# Patient Record
Sex: Female | Born: 1965
Health system: Southern US, Community
[De-identification: ages and names within clinical notes are randomized; demographics above are authoritative.]

## PROBLEM LIST (undated history)

## (undated) DIAGNOSIS — K219 Gastro-esophageal reflux disease without esophagitis: Secondary | ICD-10-CM

## (undated) DIAGNOSIS — E7800 Pure hypercholesterolemia, unspecified: Principal | ICD-10-CM

## (undated) DIAGNOSIS — F419 Anxiety disorder, unspecified: Secondary | ICD-10-CM

## (undated) DIAGNOSIS — D229 Melanocytic nevi, unspecified: Secondary | ICD-10-CM

## (undated) DIAGNOSIS — F329 Major depressive disorder, single episode, unspecified: Secondary | ICD-10-CM

## (undated) DIAGNOSIS — I10 Essential (primary) hypertension: Secondary | ICD-10-CM

## (undated) DIAGNOSIS — F32A Depression, unspecified: Secondary | ICD-10-CM

## (undated) DIAGNOSIS — E559 Vitamin D deficiency, unspecified: Secondary | ICD-10-CM

## (undated) HISTORY — PX: AUGMENTATION MAMMAPLASTY: SUR837

## (undated) HISTORY — PX: ENDOMETRIAL ABLATION: SHX621

## (undated) HISTORY — DX: Melanocytic nevi, unspecified: D22.9

## (undated) HISTORY — DX: Gastro-esophageal reflux disease without esophagitis: K21.9

## (undated) HISTORY — DX: Pure hypercholesterolemia, unspecified: E78.00

## (undated) HISTORY — DX: Vitamin D deficiency, unspecified: E55.9

## (undated) HISTORY — PX: TUBAL LIGATION: SHX77

---

## 2004-01-30 ENCOUNTER — Ambulatory Visit (HOSPITAL_COMMUNITY): Admission: RE | Admit: 2004-01-30 | Discharge: 2004-01-30 | Payer: Self-pay | Admitting: Obstetrics & Gynecology

## 2004-03-31 ENCOUNTER — Emergency Department (HOSPITAL_COMMUNITY): Admission: EM | Admit: 2004-03-31 | Discharge: 2004-03-31 | Payer: Self-pay | Admitting: Family Medicine

## 2004-07-27 ENCOUNTER — Emergency Department (HOSPITAL_COMMUNITY): Admission: EM | Admit: 2004-07-27 | Discharge: 2004-07-27 | Payer: Self-pay | Admitting: Emergency Medicine

## 2004-12-04 ENCOUNTER — Other Ambulatory Visit: Admission: RE | Admit: 2004-12-04 | Discharge: 2004-12-04 | Payer: Self-pay | Admitting: Obstetrics & Gynecology

## 2005-01-15 ENCOUNTER — Ambulatory Visit (HOSPITAL_COMMUNITY): Admission: RE | Admit: 2005-01-15 | Discharge: 2005-01-15 | Payer: Self-pay | Admitting: Obstetrics & Gynecology

## 2007-12-01 ENCOUNTER — Ambulatory Visit (HOSPITAL_COMMUNITY): Admission: RE | Admit: 2007-12-01 | Discharge: 2007-12-01 | Payer: Self-pay | Admitting: Family Medicine

## 2010-07-05 NOTE — Op Note (Signed)
[name], [name] NO.:  [id number]   MEDICAL RECORD NO.:  [id number]          PATIENT TYPE:  AMB   LOCATION:  DAY                           FACILITY:  [facility]   PHYSICIAN:  Lazaro Arms, M.D.   DATE OF BIRTH:  23-Sep-1965   DATE OF PROCEDURE:  01/30/2004  DATE OF DISCHARGE:                                 OPERATIVE REPORT   PREOPERATIVE DIAGNOSES:  1.  Menometrorrhagia.  2.  Dysmenorrhea.   POSTOPERATIVE DIAGNOSES:  1.  Menometrorrhagia.  2.  Dysmenorrhea.   PROCEDURE:  Hysteroscopy, dilatation and curettage, with endometrial  ablation using HydroThermAblator.   SURGEON:  Lazaro Arms, M.D.   ANESTHESIA:  Laryngeal mask airway.   FINDINGS:  The patient had a normal endometrial cavity.  There were no  abnormalities whatsoever.  She had a relatively small uterus that sounded to  9 cm.   DESCRIPTION OF OPERATION:  The patient was taken to the operating room and  placed in the supine position, where she underwent laryngeal mask airway.  She was placed in the dorsal lithotomy position and prepped and draped in  the usual sterile fashion.  A Graves speculum was placed.  Her cervix was  grasped.  A paracervical block using 0.5% Marcaine with 1:200,000  epinephrine was placed.  The uterus was sounded.  The cervix was dilated  serially to allow passage of the HydroThermAblator hysteroscope.  The above-  noted findings were seen.  The uterus was curetted with endometrial curette  with good uterine cry in all areas.  The HydroThermAblator was then used.  The uterus was filled with fluid to appropriate pressure.  It was then  heated up to 81 degrees Celsius and had a heating phase for 10 minutes.  All  the fluid was accounted for before, during and after the procedure.  There  was no loss of any fluid whatsoever.  The patient was awakened from  anesthesia and taken to the recovery room in good, stable condition.  All  counts were correct.  The patient will be  seen in the office next week.  She  received Ancef and Toradol prophylactically.     Luth   LHE/MEDQ  D:  01/30/2004  T:  01/30/2004  Job:  [id number]

## 2010-07-05 NOTE — Op Note (Signed)
NAME:  Allison Carr, HUFFSTETLER NO.:  [id number]   MEDICAL RECORD NO.:  [id number]          PATIENT TYPE:  AMB   LOCATION:                                FACILITY:  [facility]   PHYSICIAN:  Lazaro Arms, M.D.        DATE OF BIRTH:   DATE OF PROCEDURE:  01/15/2005  DATE OF DISCHARGE:                                 OPERATIVE REPORT   PREOPERATIVE DIAGNOSIS:  Cervical dysplasia, moderate.   POSTOPERATIVE DIAGNOSIS:  Cervical dysplasia, moderate.   PROCEDURE:  Laser ablation of the cervix.   SURGEON:  Lazaro Arms, M.D.   ANESTHESIA:  Laryngeal mask airway.   FINDINGS:  The patient was known to have moderate cervical dysplasia from  the colposcopy with biopsy in the office.  Today colposcopy is performed.  She has a relatively small lesion that goes within the cervical canal just a  touch.  It can be seen in its entirety.  As a result, a deep, relatively  small cone was performed.   DESCRIPTION OF OPERATION:  The patient was taken to the operating room and  placed in the supine position and underwent laryngeal mask airway, placed in  the dorsal lithotomy position.  Bilateral paracervical block was placed  using 0.5% Marcaine.  The holmium laser was used and got good margin around  the dysplasia taking very deep, approximately 9-cm central.  There was no  bleeding.  The patient tolerated it well.  She was awakened from anesthesia,  taken to the recovery room in stable condition.  We will see her back in a  month.      Lazaro Arms, M.D.  Electronically Signed     LHE/MEDQ  D:  01/15/2005  T:  01/15/2005  Job:  [id number]

## 2011-05-07 ENCOUNTER — Other Ambulatory Visit: Payer: Self-pay | Admitting: Adult Health

## 2011-05-07 ENCOUNTER — Other Ambulatory Visit (HOSPITAL_COMMUNITY)
Admission: RE | Admit: 2011-05-07 | Discharge: 2011-05-07 | Disposition: A | Discharge status: Home / Self Care | Payer: Self-pay | Source: Ambulatory Visit | Attending: Obstetrics and Gynecology | Admitting: Obstetrics and Gynecology

## 2011-05-07 DIAGNOSIS — Z01419 Encounter for gynecological examination (general) (routine) without abnormal findings: Secondary | ICD-10-CM | POA: Insufficient documentation

## 2011-05-07 DIAGNOSIS — Z113 Encounter for screening for infections with a predominantly sexual mode of transmission: Secondary | ICD-10-CM | POA: Insufficient documentation

## 2012-05-09 ENCOUNTER — Encounter (HOSPITAL_COMMUNITY): Payer: Self-pay | Admitting: *Deleted

## 2012-05-09 ENCOUNTER — Emergency Department (HOSPITAL_COMMUNITY)
Admission: EM | Admit: 2012-05-09 | Discharge: 2012-05-10 | Disposition: A | Discharge status: Home / Self Care | Payer: 59 | Attending: Emergency Medicine | Admitting: Emergency Medicine

## 2012-05-09 DIAGNOSIS — R5381 Other malaise: Secondary | ICD-10-CM | POA: Insufficient documentation

## 2012-05-09 DIAGNOSIS — F17200 Nicotine dependence, unspecified, uncomplicated: Secondary | ICD-10-CM | POA: Insufficient documentation

## 2012-05-09 DIAGNOSIS — I10 Essential (primary) hypertension: Secondary | ICD-10-CM | POA: Insufficient documentation

## 2012-05-09 DIAGNOSIS — F1010 Alcohol abuse, uncomplicated: Secondary | ICD-10-CM | POA: Insufficient documentation

## 2012-05-09 DIAGNOSIS — R4589 Other symptoms and signs involving emotional state: Secondary | ICD-10-CM

## 2012-05-09 DIAGNOSIS — T424X4A Poisoning by benzodiazepines, undetermined, initial encounter: Secondary | ICD-10-CM | POA: Insufficient documentation

## 2012-05-09 DIAGNOSIS — Z8659 Personal history of other mental and behavioral disorders: Secondary | ICD-10-CM | POA: Insufficient documentation

## 2012-05-09 DIAGNOSIS — Z3202 Encounter for pregnancy test, result negative: Secondary | ICD-10-CM | POA: Insufficient documentation

## 2012-05-09 DIAGNOSIS — T43502A Poisoning by unspecified antipsychotics and neuroleptics, intentional self-harm, initial encounter: Secondary | ICD-10-CM | POA: Insufficient documentation

## 2012-05-09 HISTORY — DX: Depression, unspecified: F32.A

## 2012-05-09 HISTORY — DX: Essential (primary) hypertension: I10

## 2012-05-09 HISTORY — DX: Major depressive disorder, single episode, unspecified: F32.9

## 2012-05-09 HISTORY — DX: Anxiety disorder, unspecified: F41.9

## 2012-05-09 LAB — CBC WITH DIFFERENTIAL/PLATELET
Basophils Relative: 1 % (ref 0–1)
HCT: 41.6 % (ref 36.0–46.0)
Hemoglobin: 14.4 g/dL (ref 12.0–15.0)
MCHC: 34.6 g/dL (ref 30.0–36.0)
MCV: 102 fL — ABNORMAL HIGH (ref 78.0–100.0)
Monocytes Absolute: 0.6 10*3/uL (ref 0.1–1.0)
Neutrophils Relative %: 39 % — ABNORMAL LOW (ref 43–77)
Platelets: 251 10*3/uL (ref 150–400)
RBC: 4.08 MIL/uL (ref 3.87–5.11)

## 2012-05-09 LAB — COMPREHENSIVE METABOLIC PANEL
ALT: 20 U/L (ref 0–35)
Alkaline Phosphatase: 71 U/L (ref 39–117)
BUN: 9 mg/dL (ref 6–23)
Calcium: 9.3 mg/dL (ref 8.4–10.5)
Creatinine, Ser: 0.58 mg/dL (ref 0.50–1.10)
Glucose, Bld: 92 mg/dL (ref 70–99)
Sodium: 141 mEq/L (ref 135–145)
Total Protein: 7.1 g/dL (ref 6.0–8.3)

## 2012-05-09 LAB — ETHANOL: Alcohol, Ethyl (B): 274 mg/dL — ABNORMAL HIGH (ref 0–11)

## 2012-05-09 MED ORDER — SODIUM CHLORIDE 0.9 % IV BOLUS (SEPSIS): 1000.0000 mL | Freq: Once | INTRAVENOUS | Status: DC

## 2012-05-09 MED ORDER — SODIUM CHLORIDE 0.9 % IV BOLUS (SEPSIS)
1000.0000 mL | Freq: Once | INTRAVENOUS | Status: AC
  Administered 2012-05-09: 1000 mL via INTRAVENOUS

## 2012-05-09 NOTE — ED Provider Notes (Signed)
History    This chart was scribed for Allison Lennert, MD by Charolett Bumpers, ED Scribe. The patient was seen in room [facility]. Patient's care was started at 9:57 PM.   CSN: [id number]  Arrival date & time 05/09/12  2126   First MD Initiated Contact with Patient 05/09/12 2157      Chief Complaint  Patient presents with  . Drug Overdose   Level V Caveat: Drug overdose  HPI Comments: Allison Carr is a 47 y.o. female who presents to the Emergency Department complaining for drug overdose. Per boyfriend, pt told him that she had been drinking alcohol earlier today and took x30 Xanax today. When asked why she took the Xanax, she told her boyfriend that she was "tired of everything". When questioned about taking other medications but does not respond.   PCP: Dr. Regino Schultze   Patient is a 47 y.o. female presenting with Overdose. The history is provided by the patient and a friend. No language interpreter was used.  Drug Overdose    Past Medical History  Diagnosis Date  . Hypertension   . Anxiety   . Depression     History reviewed. No pertinent past surgical history.  History reviewed. No pertinent family history.  History  Substance Use Topics  . Smoking status: Current Every Day Smoker  . Smokeless tobacco: Not on file  . Alcohol Use: Yes    OB History   Grav Para Term Preterm Abortions TAB SAB Ect Mult Living                  Review of Systems  Unable to perform ROS: Other  Drug overdose, pt lethargic and does not answer questions  Allergies  Review of patient's allergies indicates no known allergies.  Home Medications     Triage Vitals: BP 95/44  Pulse 97  Temp(Src) 97.9 F (36.6 C) (Oral)  Resp 18  Ht 5\' 2"  (1.575 m)  Wt 119 lb (53.978 kg)  BMI 21.76 kg/m2  SpO2 97%  Physical Exam  Constitutional: She appears well-developed. She appears lethargic.  HENT:  Head: Normocephalic and atraumatic.  Eyes: Conjunctivae and EOM are normal. No  scleral icterus.  Neck: Neck supple. No thyromegaly present.  Cardiovascular: Normal rate, regular rhythm and normal heart sounds.  Exam reveals no gallop and no friction rub.   No murmur heard. Pulmonary/Chest: Effort normal and breath sounds normal. No stridor. No respiratory distress. She has no wheezes. She has no rales. She exhibits no tenderness.  Abdominal: Soft. She exhibits no distension. There is no tenderness. There is no rebound.  Musculoskeletal: Normal range of motion. She exhibits no edema.  Lymphadenopathy:    She has no cervical adenopathy.  Neurological: She appears lethargic. Coordination normal.  Oriented to person and place.   Skin: No rash noted. No erythema.    ED Course  Procedures (including critical care time)  DIAGNOSTIC STUDIES: Oxygen Saturation is 97% on RA, adequate by my interpretation.    COORDINATION OF CARE:  9:40-Pt is lethargic at bedside. Will order blood work, UA, check acetaminophen level and drug and ethanol screens.    Labs Reviewed  CBC WITH DIFFERENTIAL  COMPREHENSIVE METABOLIC PANEL  ETHANOL  URINE RAPID DRUG SCREEN (HOSP PERFORMED)  URINALYSIS, ROUTINE W REFLEX MICROSCOPIC   No results found.   No diagnosis found.  Pt medically stable will get telepsych in am  MDM      The chart was scribed for me under my direct  supervision.  I personally performed the history, physical, and medical decision making and all procedures in the evaluation of this patient.Allison Lennert, MD 05/09/12 2351

## 2012-05-09 NOTE — ED Notes (Addendum)
Pt took 25-30 xanax. Pt has been drinking alcohol. According to boyfriend, pt states "I'm just tired of it."

## 2012-05-10 LAB — URINALYSIS, ROUTINE W REFLEX MICROSCOPIC
Bilirubin Urine: NEGATIVE
Glucose, UA: NEGATIVE mg/dL
Leukocytes, UA: NEGATIVE
Protein, ur: NEGATIVE mg/dL
Specific Gravity, Urine: <1.005 — ABNORMAL LOW (ref 1.005–1.030)
pH: 5.5 (ref 5.0–8.0)

## 2012-05-10 LAB — RAPID URINE DRUG SCREEN, HOSP PERFORMED
Amphetamines: NOT DETECTED
Cocaine: NOT DETECTED

## 2012-05-10 LAB — ETHANOL: Alcohol, Ethyl (B): 100 mg/dL — ABNORMAL HIGH (ref 0–11)

## 2012-05-10 MED ORDER — POTASSIUM CHLORIDE CRYS ER 20 MEQ PO TBCR
60.0000 meq | EXTENDED_RELEASE_TABLET | Freq: Once | ORAL | Status: AC
  Administered 2012-05-10: 60 meq via ORAL
  Filled 2012-05-10: qty 3

## 2012-05-10 MED ORDER — SODIUM CHLORIDE 0.9 % IV BOLUS (SEPSIS): 1000.0000 mL | Freq: Once | INTRAVENOUS | Status: DC

## 2012-05-10 NOTE — ED Provider Notes (Addendum)
2345 Assumed care/disposition of patient who arrived having taken xanax and ETOH. She is sleeping and the plan is for her to be evaluated by Tele-psych once she is able to carry on a conversation. Potassium was low and has been replaced since arrival.ETOH is 274. It will be repeated in the AM.  Results for orders placed during the hospital encounter of 05/09/12  CBC WITH DIFFERENTIAL      Result Value Range   WBC 5.7  4.0 - 10.5 K/uL   RBC 4.08  3.87 - 5.11 MIL/uL   Hemoglobin 14.4  12.0 - 15.0 g/dL   HCT 82.9  56.2 - 13.0 %   MCV 102.0 (*) 78.0 - 100.0 fL   MCH 35.3 (*) 26.0 - 34.0 pg   MCHC 34.6  30.0 - 36.0 g/dL   RDW 86.5  78.4 - 69.6 %   Platelets 251  150 - 400 K/uL   Neutrophils Relative 39 (*) 43 - 77 %   Neutro Abs 2.2  1.7 - 7.7 K/uL   Lymphocytes Relative 47 (*) 12 - 46 %   Lymphs Abs 2.7  0.7 - 4.0 K/uL   Monocytes Relative 11  3 - 12 %   Monocytes Absolute 0.6  0.1 - 1.0 K/uL   Eosinophils Relative 2  0 - 5 %   Eosinophils Absolute 0.1  0.0 - 0.7 K/uL   Basophils Relative 1  0 - 1 %   Basophils Absolute 0.1  0.0 - 0.1 K/uL  COMPREHENSIVE METABOLIC PANEL      Result Value Range   Sodium 141  135 - 145 mEq/L   Potassium 3.3 (*) 3.5 - 5.1 mEq/L   Chloride 102  96 - 112 mEq/L   CO2 29  19 - 32 mEq/L   Glucose, Bld 92  70 - 99 mg/dL   BUN 9  6 - 23 mg/dL   Creatinine, Ser 2.95  0.50 - 1.10 mg/dL   Calcium 9.3  8.4 - 28.4 mg/dL   Total Protein 7.1  6.0 - 8.3 g/dL   Albumin 3.9  3.5 - 5.2 g/dL   AST 22  0 - 37 U/L   ALT 20  0 - 35 U/L   Alkaline Phosphatase 71  39 - 117 U/L   Total Bilirubin <0.1 (*) 0.3 - 1.2 mg/dL   GFR calc non Af Amer >90  >90 mL/min   GFR calc Af Amer >90  >90 mL/min  ETHANOL      Result Value Range   Alcohol, Ethyl (B) 274 (*) 0 - 11 mg/dL  URINE RAPID DRUG SCREEN (HOSP PERFORMED)      Result Value Range   Opiates NONE DETECTED  NONE DETECTED   Cocaine NONE DETECTED  NONE DETECTED   Benzodiazepines NONE DETECTED  NONE DETECTED   Amphetamines NONE DETECTED  NONE DETECTED   Tetrahydrocannabinol NONE DETECTED  NONE DETECTED   Barbiturates NONE DETECTED  NONE DETECTED  URINALYSIS, ROUTINE W REFLEX MICROSCOPIC      Result Value Range   Color, Urine YELLOW  YELLOW   APPearance CLEAR  CLEAR   Specific Gravity, Urine <1.005 (*) 1.005 - 1.030   pH 5.5  5.0 - 8.0   Glucose, UA NEGATIVE  NEGATIVE mg/dL   Hgb urine dipstick SMALL (*) NEGATIVE   Bilirubin Urine NEGATIVE  NEGATIVE   Ketones, ur NEGATIVE  NEGATIVE mg/dL   Protein, ur NEGATIVE  NEGATIVE mg/dL   Urobilinogen, UA 0.2  0.0 - 1.0 mg/dL  Nitrite NEGATIVE  NEGATIVE   Leukocytes, UA NEGATIVE  NEGATIVE  ACETAMINOPHEN LEVEL      Result Value Range   Acetaminophen (Tylenol), Serum <15.0  10 - 30 ug/mL  URINE MICROSCOPIC-ADD ON      Result Value Range   Squamous Epithelial / LPF RARE  RARE   WBC, UA 0-2  <3 WBC/hpf   RBC / HPF 0-2  <3 RBC/hpf   Bacteria, UA RARE  RARE  POCT PREGNANCY, URINE      Result Value Range   Preg Test, Ur NEGATIVE  NEGATIVE   [phone] Tele psych consult has been completed by Dr. Debbora Presto. They feel patient is good to be discharged home once she is cleared medically. She can follow up with outpatient alcohol and depression. Will wait 6 AM ETOH  0600 ETOH is 100. Spoke with the patient. She is ready for discharge. Nicoletta Dress. Colon Branch, MD 05/10/12 Jeralyn Bennett  Nicoletta Dress. Colon Branch, MD 05/10/12 [phone]

## 2012-05-10 NOTE — ED Notes (Signed)
Patient speaking with telepsych at this time. Patient is angry and agitated. States she wants to go home.

## 2013-06-01 ENCOUNTER — Ambulatory Visit (INDEPENDENT_AMBULATORY_CARE_PROVIDER_SITE_OTHER): Payer: BC Managed Care – PPO | Admitting: Adult Health

## 2013-06-01 ENCOUNTER — Other Ambulatory Visit (HOSPITAL_COMMUNITY)
Admission: RE | Admit: 2013-06-01 | Discharge: 2013-06-01 | Disposition: A | Discharge status: Home / Self Care | Payer: BC Managed Care – PPO | Source: Ambulatory Visit | Attending: Adult Health | Admitting: Adult Health

## 2013-06-01 ENCOUNTER — Encounter: Payer: Self-pay | Admitting: Adult Health

## 2013-06-01 VITALS — BP 152/94 | HR 76 | Ht 62.0 in | Wt 149.5 lb

## 2013-06-01 DIAGNOSIS — I10 Essential (primary) hypertension: Secondary | ICD-10-CM

## 2013-06-01 DIAGNOSIS — Z1212 Encounter for screening for malignant neoplasm of rectum: Secondary | ICD-10-CM

## 2013-06-01 DIAGNOSIS — D229 Melanocytic nevi, unspecified: Secondary | ICD-10-CM

## 2013-06-01 DIAGNOSIS — Z1151 Encounter for screening for human papillomavirus (HPV): Secondary | ICD-10-CM | POA: Insufficient documentation

## 2013-06-01 DIAGNOSIS — F329 Major depressive disorder, single episode, unspecified: Secondary | ICD-10-CM

## 2013-06-01 DIAGNOSIS — F32A Depression, unspecified: Secondary | ICD-10-CM

## 2013-06-01 DIAGNOSIS — Z01419 Encounter for gynecological examination (general) (routine) without abnormal findings: Secondary | ICD-10-CM

## 2013-06-01 HISTORY — DX: Melanocytic nevi, unspecified: D22.9

## 2013-06-01 LAB — CBC
HEMATOCRIT: 43.2 % (ref 36.0–46.0)
HEMOGLOBIN: 14.9 g/dL (ref 12.0–15.0)
MCH: 35.2 pg — ABNORMAL HIGH (ref 26.0–34.0)
MCHC: 34.5 g/dL (ref 30.0–36.0)
MCV: 102.1 fL — ABNORMAL HIGH (ref 78.0–100.0)
Platelets: 254 10*3/uL (ref 150–400)
RBC: 4.23 MIL/uL (ref 3.87–5.11)
RDW: 12.7 % (ref 11.5–15.5)
WBC: 6.6 10*3/uL (ref 4.0–10.5)

## 2013-06-01 LAB — HEMOCCULT GUIAC POC 1CARD (OFFICE): Fecal Occult Blood, POC: NEGATIVE

## 2013-06-01 MED ORDER — LOSARTAN POTASSIUM 50 MG PO TABS: 50.0000 mg | ORAL_TABLET | Freq: Every day | ORAL | Status: DC

## 2013-06-01 MED ORDER — VENLAFAXINE HCL ER 75 MG PO CP24: 75.0000 mg | ORAL_CAPSULE | Freq: Every day | ORAL | Status: DC

## 2013-06-01 NOTE — Patient Instructions (Addendum)
Physical in 1 year Mammogram now and yearly colonoscopy at 31 See Dr Tarri Glenn regarding moles Return in 2 weeks for recheck

## 2013-06-01 NOTE — Progress Notes (Signed)
Patient ID: Allison Carr, female   DOB: May 05, 1965, 48 y.o.   MRN: [id number] History of Present Illness: Allison Carr is a 48 year old white female divorced in for a pap and physical.She has been off BP meds since December.    Current Medications, Allergies, Past Medical History, Past Surgical History, Family History and Social History were reviewed in [employer] record.     Review of Systems: Patient denies any headaches, blurred vision, shortness of breath, chest pain, abdominal pain, problems with bowel movements, urination, or intercourse. No joint pain or has depression since divorce.    Physical Exam:BP 152/94  Pulse 76  Ht 5\' 2"  (1.575 m)  Wt 149 lb 8 oz (67.813 kg)  BMI 27.34 kg/m2BP was 152/108 on arrival General:  Well developed, well nourished, no acute distress Skin:  Warm and dry Neck:  Midline trachea, normal thyroid Lungs; Clear to auscultation bilaterally Breast:  No dominant palpable mass, retraction, or nipple discharge, has bilateral implants, has mole left chest of concern Cardiovascular: Regular rate and rhythm Abdomen:  Soft, non tender, no hepatosplenomegaly Pelvic:  External genitalia is normal in appearance.has mole of concern right inner thigh,irregular with black scaly area.  The vagina is normal in appearance. The cervix is bulbous.Pap with HPV performed.  Uterus is felt to be normal size, shape, and contour.  No   adnexal masses or tenderness noted. Rectal: Good sphincter tone, no polyps, or hemorrhoids felt.  Hemoccult negative. Extremities:  No swelling or varicosities noted Psych:  Alert and cooperative,seems happy   Impression:  Yearly gyn exam Hypertension Moles of concern Depression    Plan: Get mammogram now and yearly Return in 2 weeks for BP check Check CBC,CMP,TSH and lipids Refilled losartin 50 mg #30 1 daily x 1 year  and refilled Effexor ER 75 mg #30 1 daily x 1 year Physical in 1 year Call Dr Allison Carr to  evaluate moles

## 2013-06-02 LAB — LIPID PANEL
CHOLESTEROL: 279 mg/dL — AB (ref 0–200)
HDL: 83 mg/dL (ref >39)
LDL CALC: 165 mg/dL — AB (ref 0–99)
TRIGLYCERIDES: 155 mg/dL — AB (ref <150)
Total CHOL/HDL Ratio: 3.4 Ratio
VLDL: 31 mg/dL (ref 0–40)

## 2013-06-02 LAB — COMPREHENSIVE METABOLIC PANEL
ALBUMIN: 4.2 g/dL (ref 3.5–5.2)
ALK PHOS: 68 U/L (ref 39–117)
ALT: 25 U/L (ref 0–35)
AST: 25 U/L (ref 0–37)
BUN: 7 mg/dL (ref 6–23)
CALCIUM: 9.6 mg/dL (ref 8.4–10.5)
CHLORIDE: 99 meq/L (ref 96–112)
CO2: 27 mEq/L (ref 19–32)
Creat: 0.53 mg/dL (ref 0.50–1.10)
GLUCOSE: 69 mg/dL — AB (ref 70–99)
POTASSIUM: 4.4 meq/L (ref 3.5–5.3)
SODIUM: 135 meq/L (ref 135–145)
TOTAL PROTEIN: 6.8 g/dL (ref 6.0–8.3)
Total Bilirubin: 0.5 mg/dL (ref 0.2–1.2)

## 2013-06-02 LAB — TSH: TSH: 3.008 u[IU]/mL (ref 0.350–4.500)

## 2013-06-07 ENCOUNTER — Telehealth: Payer: Self-pay | Admitting: Adult Health

## 2013-06-07 MED ORDER — ESOMEPRAZOLE MAGNESIUM 40 MG PO CPDR: 40.0000 mg | DELAYED_RELEASE_CAPSULE | Freq: Every day | ORAL | Status: DC

## 2013-06-07 NOTE — Telephone Encounter (Signed)
Refilled nexium 

## 2013-06-07 NOTE — Telephone Encounter (Signed)
Pt requesting refill on Nexium, states thought refill was done on 06/01/2013 appt but pharmacy states does not have RX.

## 2013-06-14 ENCOUNTER — Telehealth: Payer: Self-pay | Admitting: Adult Health

## 2013-06-14 NOTE — Telephone Encounter (Signed)
Pt aware of labs and pap 

## 2013-06-15 ENCOUNTER — Ambulatory Visit (INDEPENDENT_AMBULATORY_CARE_PROVIDER_SITE_OTHER): Payer: BC Managed Care – PPO | Admitting: Adult Health

## 2013-06-15 ENCOUNTER — Encounter: Payer: Self-pay | Admitting: Adult Health

## 2013-06-15 VITALS — BP 138/92 | Ht 62.0 in | Wt 152.0 lb

## 2013-06-15 DIAGNOSIS — I10 Essential (primary) hypertension: Secondary | ICD-10-CM

## 2013-06-15 MED ORDER — ESOMEPRAZOLE MAGNESIUM 40 MG PO CPDR: 40.0000 mg | DELAYED_RELEASE_CAPSULE | Freq: Every day | ORAL | Status: DC

## 2013-06-15 NOTE — Patient Instructions (Signed)
Follow up in  1year Increase activity

## 2013-06-15 NOTE — Progress Notes (Signed)
Subjective:     Patient ID: Allison Carr, female   DOB: 19-Aug-1965, 48 y.o.   MRN: [id number]  HPI Allison Carr is back for BP check, it is better,no complaints.  Review of Systems See HPI Reviewed past medical,surgical, social and family history. Reviewed medications and allergies.     Objective:   Physical Exam BP 138/92  Ht 5\' 2"  (1.575 m)  Wt 152 lb (68.947 kg)  BMI 27.79 kg/m2   Taking meds,doing better, has appt this pm with Dr Tarri Glenn for mole removal Had reviewed last pm by phone.  Assessment:     Hypertension    Plan:     Continue meds Increase activity Follow up in 1 year

## 2013-06-22 ENCOUNTER — Telehealth: Payer: Self-pay | Admitting: *Deleted

## 2013-06-22 NOTE — Telephone Encounter (Signed)
Pt aware that insurance is not going to cover the generic Nexium.

## 2013-12-19 ENCOUNTER — Encounter: Payer: Self-pay | Admitting: Adult Health

## 2014-03-07 ENCOUNTER — Telehealth: Payer: Self-pay | Admitting: Adult Health

## 2014-03-08 MED ORDER — LISINOPRIL 20 MG PO TABS: 20.0000 mg | ORAL_TABLET | Freq: Every day | ORAL | Status: DC

## 2014-03-08 NOTE — Telephone Encounter (Signed)
Left message wil try lisinopril check BP in about 4 weeks

## 2014-04-26 ENCOUNTER — Telehealth: Payer: Self-pay | Admitting: Adult Health

## 2014-04-26 NOTE — Telephone Encounter (Signed)
Left message x 1. JSY 

## 2014-04-27 MED ORDER — TRIAMTERENE-HCTZ 37.5-25 MG PO CAPS: 1.0000 | ORAL_CAPSULE | Freq: Every day | ORAL | Status: DC

## 2014-04-27 NOTE — Telephone Encounter (Signed)
Has cough with lisinopril wil stop and rx dyazide check BP and cal me in about 4 weeks

## 2014-04-27 NOTE — Telephone Encounter (Signed)
Spoke with pt. Pt is on Lisinopril. It is causing a dry cough. Pt is requesting another BP med that's on the $4.00 [facility] list. Please advise. Pt is also on Effexor. Her father passed away and had several prescriptions of an antidepressant. She was wondering if she could take that med. I advised not to since it wasn't prescribed to her. Pt voiced understanding. [place]

## 2014-06-01 ENCOUNTER — Other Ambulatory Visit: Payer: Self-pay | Admitting: Adult Health

## 2014-12-01 ENCOUNTER — Other Ambulatory Visit: Payer: Self-pay | Admitting: Adult Health

## 2014-12-28 ENCOUNTER — Other Ambulatory Visit (HOSPITAL_COMMUNITY): Payer: Self-pay | Admitting: Family Medicine

## 2014-12-28 DIAGNOSIS — Z1231 Encounter for screening mammogram for malignant neoplasm of breast: Secondary | ICD-10-CM

## 2015-01-05 ENCOUNTER — Other Ambulatory Visit: Payer: Self-pay | Admitting: Adult Health

## 2015-01-17 ENCOUNTER — Ambulatory Visit (HOSPITAL_COMMUNITY): Payer: PRIVATE HEALTH INSURANCE

## 2015-01-24 ENCOUNTER — Inpatient Hospital Stay (HOSPITAL_COMMUNITY): Admission: RE | Admit: 2015-01-24 | Payer: PRIVATE HEALTH INSURANCE | Source: Ambulatory Visit

## 2015-01-24 ENCOUNTER — Ambulatory Visit (HOSPITAL_COMMUNITY): Payer: PRIVATE HEALTH INSURANCE

## 2015-02-07 ENCOUNTER — Other Ambulatory Visit: Payer: Self-pay | Admitting: Adult Health

## 2015-02-09 ENCOUNTER — Telehealth: Payer: Self-pay | Admitting: Obstetrics & Gynecology

## 2015-02-09 ENCOUNTER — Telehealth: Payer: Self-pay | Admitting: *Deleted

## 2015-02-09 MED ORDER — TRIAMTERENE-HCTZ 37.5-25 MG PO CAPS: 1.0000 | ORAL_CAPSULE | Freq: Every day | ORAL | Status: DC

## 2015-02-09 NOTE — Telephone Encounter (Signed)
Pt requesting refill on Triamterene-HCTZ 37.5-25 mg.

## 2015-02-28 ENCOUNTER — Ambulatory Visit: Payer: Self-pay | Admitting: Adult Health

## 2015-03-12 ENCOUNTER — Telehealth: Payer: Self-pay | Admitting: Adult Health

## 2015-03-12 NOTE — Telephone Encounter (Signed)
Spoke with pt. Pt has only 3 BP pills left. I advised she needs an appt(that was on the last refill note). Pt voiced understanding and call was transferred to front desk for appt. [place]

## 2015-03-12 NOTE — Telephone Encounter (Signed)
Pt called stating that she is on a blood pressure medication and she only has 2 days worth of the medication, pt would like to know if [facility] would like her to come to get her blood pressure checked or can she just call it in. Please contact pt

## 2015-03-14 ENCOUNTER — Encounter: Payer: Self-pay | Admitting: Women's Health

## 2015-03-14 ENCOUNTER — Ambulatory Visit: Payer: Self-pay | Admitting: Women's Health

## 2015-03-14 ENCOUNTER — Ambulatory Visit (INDEPENDENT_AMBULATORY_CARE_PROVIDER_SITE_OTHER): Payer: BLUE CROSS/BLUE SHIELD | Admitting: Women's Health

## 2015-03-14 VITALS — BP 122/74 | HR 64 | Wt 146.0 lb

## 2015-03-14 DIAGNOSIS — I10 Essential (primary) hypertension: Secondary | ICD-10-CM

## 2015-03-14 DIAGNOSIS — Z0130 Encounter for examination of blood pressure without abnormal findings: Secondary | ICD-10-CM

## 2015-03-14 MED ORDER — TRIAMTERENE-HCTZ 37.5-25 MG PO CAPS: 1.0000 | ORAL_CAPSULE | Freq: Every day | ORAL | Status: DC

## 2015-03-14 NOTE — Progress Notes (Signed)
Patient ID: Allison Carr, female   DOB: 1965-09-12, 50 y.o.   MRN: [id number]   [facility] Visit  Patient name: Allison Carr MRN [id number]  Date of birth: 1965/08/22  CC & HPI:  Allison Carr is a 50 y.o. G29P2 Caucasian female presenting today for bp med refill. Is on dyazide 37.5/25mg  daily and doing well, no complaints. Does not have PCP.  No LMP recorded. Patient has had an ablation. Last pap 06/01/13- neg  Pertinent History Reviewed:  Medical & Surgical Hx:   Past Medical History  Diagnosis Date  . Hypertension   . Anxiety   . Depression   . GERD (gastroesophageal reflux disease)   . Numerous moles 06/01/2013    Refer to Dr Tarri Glenn to check moles   Past Surgical History  Procedure Laterality Date  . Endometrial ablation    . Tubal ligation     Medications: Reviewed & Updated - see associated section Social History: Reviewed -  reports that she has been smoking Cigarettes.  She has been smoking about 1.00 pack per day. She has never used smokeless tobacco.  Objective Findings:  Vitals: BP 122/74 mmHg  Pulse 64  Wt 146 lb (66.225 kg) Body mass index is 26.7 kg/(m^2).  Physical Examination: General appearance - alert, well appearing, and in no distress Heart - normal rate and regular rhythm  No results found for this or any previous visit (from the past 24 hour(s)).   Assessment & Plan:  A:   BP check, refill bp meds  Needs physical scheduled, no pap  P:  Refilled dyazide 37.5/25mg  daily w/ 2RF  Return in about 3 months (around 06/12/2015) for Pap & physical w/ JAG.  Tawnya Crook CNM, [facility] 03/14/2015 10:05 AM

## 2015-06-11 ENCOUNTER — Other Ambulatory Visit: Payer: Self-pay | Admitting: Adult Health

## 2015-06-13 ENCOUNTER — Telehealth: Payer: Self-pay | Admitting: Adult Health

## 2015-06-13 ENCOUNTER — Other Ambulatory Visit: Payer: BLUE CROSS/BLUE SHIELD | Admitting: Adult Health

## 2015-06-13 MED ORDER — TRIAMTERENE-HCTZ 37.5-25 MG PO CAPS: 1.0000 | ORAL_CAPSULE | Freq: Every day | ORAL | Status: DC

## 2015-06-13 MED ORDER — VENLAFAXINE HCL ER 75 MG PO CP24: ORAL_CAPSULE | ORAL | Status: DC

## 2015-06-13 NOTE — Telephone Encounter (Signed)
Spoke with pt letting her know 2 refills were sent to pharmacy. Pt needs to reschedule appt for physical, no pap within 2 months. Pt voiced understanding and call was transferred to front desk for appt. [place]

## 2015-06-13 NOTE — Telephone Encounter (Signed)
Spoke with pt. Pt needs refills on generic Dyazide and gen Effexor. Thanks!! [place]

## 2015-06-27 ENCOUNTER — Encounter: Payer: Self-pay | Admitting: Adult Health

## 2015-06-27 ENCOUNTER — Ambulatory Visit (INDEPENDENT_AMBULATORY_CARE_PROVIDER_SITE_OTHER): Payer: BLUE CROSS/BLUE SHIELD | Admitting: Adult Health

## 2015-06-27 VITALS — BP 132/90 | HR 88 | Ht 62.25 in | Wt 146.0 lb

## 2015-06-27 DIAGNOSIS — Z1212 Encounter for screening for malignant neoplasm of rectum: Secondary | ICD-10-CM | POA: Diagnosis not present

## 2015-06-27 DIAGNOSIS — Z01419 Encounter for gynecological examination (general) (routine) without abnormal findings: Secondary | ICD-10-CM | POA: Diagnosis not present

## 2015-06-27 DIAGNOSIS — F32A Depression, unspecified: Secondary | ICD-10-CM

## 2015-06-27 DIAGNOSIS — I10 Essential (primary) hypertension: Secondary | ICD-10-CM

## 2015-06-27 DIAGNOSIS — F329 Major depressive disorder, single episode, unspecified: Secondary | ICD-10-CM

## 2015-06-27 LAB — HEMOCCULT GUIAC POC 1CARD (OFFICE): FECAL OCCULT BLD: NEGATIVE

## 2015-06-27 MED ORDER — VENLAFAXINE HCL ER 75 MG PO CP24: ORAL_CAPSULE | ORAL | Status: DC

## 2015-06-27 MED ORDER — TRIAMTERENE-HCTZ 37.5-25 MG PO CAPS: 1.0000 | ORAL_CAPSULE | Freq: Every day | ORAL | Status: DC

## 2015-06-27 NOTE — Progress Notes (Signed)
Patient ID: Allison Carr, female   DOB: Jun 25, 1965, 51 y.o.   MRN: [id number] History of Present Illness: Allison Carr is a 50 year old white female, divorced in for a well woman gyn exam and refill meds, she had a normal pap with negative HPV 06/01/13.   Current Medications, Allergies, Past Medical History, Past Surgical History, Family History and Social History were reviewed in [employer] record.     Review of Systems:  Patient denies any headaches, hearing loss, fatigue, blurred vision, shortness of breath, chest pain, abdominal pain, problems with bowel movements, urination, or intercourse. No joint pain or mood swings.She does have some hot flashes, and hands ache, she is a Emergency planning/management officer.She had an ablation years ago and does not have a period.   Physical Exam:BP 132/90 mmHg  Pulse 88  Ht 5' 2.25" (1.581 m)  Wt 146 lb (66.225 kg)  BMI 26.49 kg/m2 General:  Well developed, well nourished, no acute distress Skin:  Warm and dry,tan, has several tattoos Neck:  Midline trachea, normal thyroid, good ROM, no lymphadenopathy Lungs; Clear to auscultation bilaterally Breast:  No dominant palpable mass, retraction, or nipple discharge,has bilateral implants Cardiovascular: Regular rate and rhythm Abdomen:  Soft, non tender, no hepatosplenomegaly Pelvic:  External genitalia is normal in appearance, no lesions.  The vagina is normal in appearance. Urethra has no lesions or masses. The cervix is bulbous.  Uterus is felt to be normal size, shape, and contour.  No adnexal masses or tenderness noted.Bladder is non tender, no masses felt. Rectal: Good sphincter tone, no polyps, or hemorrhoids felt.  Hemoccult negative. Extremities/musculoskeletal:  No swelling or varicosities noted, no clubbing or cyanosis Psych:  No mood changes, alert and cooperative,seems happy   Impression: Well woman gyn exam no pap Hypertension Depression     Plan: Check CBC,CMP,TSH and lipids,A1c and  vitamin D Refilled dyazide 37.5-25 mg #90 take 1 daily with 3 refills Refilled effexor-xr 75 mg #90 take 1 daily with 3 refills Mammogram yearly Colonoscopy at 50 Pap and physical in 1 year

## 2015-06-27 NOTE — Patient Instructions (Signed)
Pap and physical in 1 year Mammogram in 1 year Colonoscopy at 20

## 2015-06-28 ENCOUNTER — Telehealth: Payer: Self-pay | Admitting: Adult Health

## 2015-06-28 ENCOUNTER — Encounter: Payer: Self-pay | Admitting: Adult Health

## 2015-06-28 DIAGNOSIS — E559 Vitamin D deficiency, unspecified: Secondary | ICD-10-CM

## 2015-06-28 HISTORY — DX: Vitamin D deficiency, unspecified: E55.9

## 2015-06-28 LAB — CBC
Hematocrit: 46.4 % (ref 34.0–46.6)
Hemoglobin: 15.7 g/dL (ref 11.1–15.9)
MCH: 36.1 pg — AB (ref 26.6–33.0)
MCHC: 33.8 g/dL (ref 31.5–35.7)
MCV: 107 fL — AB (ref 79–97)
PLATELETS: 281 10*3/uL (ref 150–379)
RBC: 4.35 x10E6/uL (ref 3.77–5.28)
RDW: 12.6 % (ref 12.3–15.4)
WBC: 5.7 10*3/uL (ref 3.4–10.8)

## 2015-06-28 LAB — COMPREHENSIVE METABOLIC PANEL
A/G RATIO: 1.8 (ref 1.2–2.2)
ALK PHOS: 74 IU/L (ref 39–117)
ALT: 37 IU/L — AB (ref 0–32)
AST: 52 IU/L — AB (ref 0–40)
Albumin: 4.4 g/dL (ref 3.5–5.5)
BILIRUBIN TOTAL: 0.2 mg/dL (ref 0.0–1.2)
BUN/Creatinine Ratio: 9 (ref 9–23)
BUN: 5 mg/dL — AB (ref 6–24)
CHLORIDE: 97 mmol/L (ref 96–106)
CO2: 25 mmol/L (ref 18–29)
Calcium: 9.5 mg/dL (ref 8.7–10.2)
Creatinine, Ser: 0.57 mg/dL (ref 0.57–1.00)
GFR calc Af Amer: 126 mL/min/{1.73_m2} (ref >59)
GFR calc non Af Amer: 109 mL/min/{1.73_m2} (ref >59)
GLUCOSE: 84 mg/dL (ref 65–99)
Globulin, Total: 2.4 g/dL (ref 1.5–4.5)
POTASSIUM: 4 mmol/L (ref 3.5–5.2)
Sodium: 141 mmol/L (ref 134–144)
Total Protein: 6.8 g/dL (ref 6.0–8.5)

## 2015-06-28 LAB — HEMOGLOBIN A1C
ESTIMATED AVERAGE GLUCOSE: 111 mg/dL
HEMOGLOBIN A1C: 5.5 % (ref 4.8–5.6)

## 2015-06-28 LAB — VITAMIN D 25 HYDROXY (VIT D DEFICIENCY, FRACTURES): Vit D, 25-Hydroxy: 15.9 ng/mL — ABNORMAL LOW (ref 30.0–100.0)

## 2015-06-28 LAB — LIPID PANEL
Chol/HDL Ratio: 2.6 ratio units (ref 0.0–4.4)
Cholesterol, Total: 243 mg/dL — ABNORMAL HIGH (ref 100–199)
HDL: 92 mg/dL (ref >39)
LDL Calculated: 121 mg/dL — ABNORMAL HIGH (ref 0–99)
Triglycerides: 151 mg/dL — ABNORMAL HIGH (ref 0–149)
VLDL Cholesterol Cal: 30 mg/dL (ref 5–40)

## 2015-06-28 LAB — TSH: TSH: 2.43 u[IU]/mL (ref 0.450–4.500)

## 2015-06-28 MED ORDER — CHOLECALCIFEROL 125 MCG (5000 UT) PO CAPS: 5000.0000 [IU] | ORAL_CAPSULE | Freq: Every day | ORAL | Status: DC

## 2015-06-28 NOTE — Telephone Encounter (Signed)
Pt aware of labs, needs to take vitamin D3 5000 IU daily and decrease alcohol and tylenol use

## 2015-07-17 ENCOUNTER — Telehealth: Payer: Self-pay | Admitting: Adult Health

## 2015-07-17 ENCOUNTER — Telehealth: Payer: Self-pay | Admitting: *Deleted

## 2015-07-17 MED ORDER — LORAZEPAM 0.5 MG PO TABS: ORAL_TABLET | ORAL | Status: DC

## 2015-07-17 MED ORDER — VENLAFAXINE HCL ER 150 MG PO CP24: 150.0000 mg | ORAL_CAPSULE | Freq: Every day | ORAL | Status: DC

## 2015-07-17 NOTE — Telephone Encounter (Signed)
Allison Carr called, is having hard time sleeping, she is on effexor but thinks it needs increasing used to take 150 mg She found friend murdered awhile back and can't stop thinking about it.will increase effexor to 150 mg and rx ativan

## 2015-07-17 NOTE — Telephone Encounter (Signed)
Left message I called 

## 2015-09-24 ENCOUNTER — Other Ambulatory Visit: Payer: Self-pay | Admitting: *Deleted

## 2015-09-24 MED ORDER — VENLAFAXINE HCL ER 150 MG PO CP24: 150.0000 mg | ORAL_CAPSULE | Freq: Every day | ORAL | 3 refills | Status: DC

## 2016-06-26 ENCOUNTER — Other Ambulatory Visit: Payer: Self-pay | Admitting: Adult Health

## 2016-09-28 ENCOUNTER — Other Ambulatory Visit: Payer: Self-pay | Admitting: Adult Health

## 2016-10-07 ENCOUNTER — Other Ambulatory Visit: Payer: Self-pay | Admitting: Adult Health

## 2016-12-24 ENCOUNTER — Other Ambulatory Visit: Payer: Self-pay | Admitting: Adult Health

## 2017-03-22 ENCOUNTER — Other Ambulatory Visit: Payer: Self-pay | Admitting: Adult Health

## 2017-03-25 ENCOUNTER — Other Ambulatory Visit: Payer: BLUE CROSS/BLUE SHIELD | Admitting: Adult Health

## 2017-03-25 ENCOUNTER — Telehealth: Payer: Self-pay | Admitting: Adult Health

## 2017-03-25 MED ORDER — VENLAFAXINE HCL ER 150 MG PO CP24: 150.0000 mg | ORAL_CAPSULE | Freq: Every day | ORAL | 0 refills | Status: DC

## 2017-03-25 NOTE — Telephone Encounter (Signed)
Pt had to cancel appt due to emergency , will refill effexor, she made another appt.

## 2017-04-22 ENCOUNTER — Other Ambulatory Visit: Payer: Self-pay | Admitting: Adult Health

## 2017-05-05 ENCOUNTER — Encounter: Payer: Self-pay | Admitting: Adult Health

## 2017-05-05 ENCOUNTER — Ambulatory Visit: Payer: BLUE CROSS/BLUE SHIELD | Admitting: Adult Health

## 2017-05-05 ENCOUNTER — Other Ambulatory Visit (HOSPITAL_COMMUNITY)
Admission: RE | Admit: 2017-05-05 | Discharge: 2017-05-05 | Disposition: A | Discharge status: Home / Self Care | Payer: BLUE CROSS/BLUE SHIELD | Source: Ambulatory Visit | Attending: Adult Health | Admitting: Adult Health

## 2017-05-05 VITALS — BP 148/96 | HR 87 | Ht 62.0 in | Wt 151.0 lb

## 2017-05-05 DIAGNOSIS — I10 Essential (primary) hypertension: Secondary | ICD-10-CM

## 2017-05-05 DIAGNOSIS — Z13220 Encounter for screening for lipoid disorders: Secondary | ICD-10-CM

## 2017-05-05 DIAGNOSIS — F329 Major depressive disorder, single episode, unspecified: Secondary | ICD-10-CM | POA: Diagnosis not present

## 2017-05-05 DIAGNOSIS — Z01419 Encounter for gynecological examination (general) (routine) without abnormal findings: Secondary | ICD-10-CM | POA: Insufficient documentation

## 2017-05-05 DIAGNOSIS — Z01411 Encounter for gynecological examination (general) (routine) with abnormal findings: Secondary | ICD-10-CM | POA: Diagnosis not present

## 2017-05-05 DIAGNOSIS — Z1212 Encounter for screening for malignant neoplasm of rectum: Secondary | ICD-10-CM

## 2017-05-05 DIAGNOSIS — F32A Depression, unspecified: Secondary | ICD-10-CM

## 2017-05-05 DIAGNOSIS — Z1211 Encounter for screening for malignant neoplasm of colon: Secondary | ICD-10-CM | POA: Diagnosis not present

## 2017-05-05 DIAGNOSIS — Z1329 Encounter for screening for other suspected endocrine disorder: Secondary | ICD-10-CM | POA: Diagnosis not present

## 2017-05-05 LAB — HEMOCCULT GUIAC POC 1CARD (OFFICE): Fecal Occult Blood, POC: NEGATIVE

## 2017-05-05 MED ORDER — TRIAMTERENE-HCTZ 37.5-25 MG PO CAPS: ORAL_CAPSULE | ORAL | 4 refills | Status: DC

## 2017-05-05 MED ORDER — ESOMEPRAZOLE MAGNESIUM 40 MG PO CPDR: 40.0000 mg | DELAYED_RELEASE_CAPSULE | Freq: Every day | ORAL | 11 refills | Status: DC

## 2017-05-05 MED ORDER — VENLAFAXINE HCL ER 75 MG PO CP24: ORAL_CAPSULE | ORAL | 12 refills | Status: DC

## 2017-05-05 NOTE — Progress Notes (Signed)
Patient ID: Allison Carr, female   DOB: 06/30/1965, 52 y.o.   MRN: [id number] History of Present Illness: Allison Carr is a 52 year old white female, divorced in for well woman gyn exam and pap. PCP is [place].    Current Medications, Allergies, Past Medical History, Past Surgical History, Family History and Social History were reviewed in [employer] record.     Review of Systems:  Patient denies any headaches, hearing loss, fatigue, blurred vision, shortness of breath, chest pain, abdominal pain, problems with bowel movements, urination, or intercourse. No joint pain or mood swings.  Physical Exam:BP (!) 148/96 (BP Location: Left Arm, Cuff Size: Normal)   Pulse 87   Ht 5\' 2"  (1.575 m)   Wt 151 lb (68.5 kg)   BMI 27.62 kg/m  General:  Well developed, well nourished, no acute distress Skin:  Warm and dry Neck:  Midline trachea, normal thyroid, good ROM, no lymphadenopathy Lungs; Clear to auscultation bilaterally Breast:  No dominant palpable mass, retraction, or nipple discharge,has bilateral implants Cardiovascular: Regular rate and rhythm Abdomen:  Soft, non tender, no hepatosplenomegaly Pelvic:  External genitalia is normal in appearance, no lesions.  The vagina is normal in appearance. Urethra has no lesions or masses. The cervix is bulbous. Pap with HPV performed. Uterus is felt to be normal size, shape, and contour.  No adnexal masses or tenderness noted.Bladder is non tender, no masses felt. Rectal: Good sphincter tone, no polyps, or hemorrhoids felt.  Hemoccult negative. Extremities/musculoskeletal:  No swelling or varicosities noted, no clubbing or cyanosis Psych:  No mood changes, alert and cooperative,seems happy PHQ 2 score 1, is on Effexor, but thinks it needs increasing, son in [place] now and she gets teary easily.Will increase Effexor to 225 mg daily  Impression: 1. Encounter for gynecological examination with Papanicolaou smear of cervix   2. Screening  for colorectal cancer   3. Essential hypertension   4. Depression, unspecified depression type   5. Screening cholesterol level   6. Screening for thyroid disorder       Plan: Check CBC,CMP,TSH and lipids Meds ordered this encounter  Medications  . esomeprazole (NEXIUM) 40 MG capsule    Sig: Take 1 capsule (40 mg total) by mouth daily at 12 noon.    Dispense:  30 capsule    Refill:  11    Order Specific Question:   Supervising Provider    Answer:   Elonda Husky, LUTHER H [[id number]]  . triamterene-hydrochlorothiazide (DYAZIDE) 37.5-25 MG capsule    Sig: TAKE ONE CAPSULE EVERY DAY -    Dispense:  90 capsule    Refill:  4    Order Specific Question:   Supervising Provider    Answer:   Elonda Husky, LUTHER H [[id number]]  . venlafaxine XR (EFFEXOR-XR) 75 MG 24 hr capsule    Sig: Take 3 daily at same time    Dispense:  90 capsule    Refill:  12    Order Specific Question:   Supervising Provider    Answer:   Tania Ade H [[id number]]  F/U in 6 weeks for ROS and BP check Physical in  1 year Pap in 3 years if normal Get mammogram now and yearly

## 2017-05-08 DIAGNOSIS — Z1329 Encounter for screening for other suspected endocrine disorder: Secondary | ICD-10-CM | POA: Diagnosis not present

## 2017-05-08 DIAGNOSIS — Z01419 Encounter for gynecological examination (general) (routine) without abnormal findings: Secondary | ICD-10-CM | POA: Diagnosis not present

## 2017-05-08 DIAGNOSIS — Z13220 Encounter for screening for lipoid disorders: Secondary | ICD-10-CM | POA: Diagnosis not present

## 2017-05-08 DIAGNOSIS — I10 Essential (primary) hypertension: Secondary | ICD-10-CM | POA: Diagnosis not present

## 2017-05-08 LAB — CYTOLOGY - PAP
ADEQUACY: ABSENT
CHLAMYDIA, DNA PROBE: NEGATIVE
DIAGNOSIS: NEGATIVE
HPV: NOT DETECTED
Neisseria Gonorrhea: NEGATIVE

## 2017-05-09 LAB — LIPID PANEL
Chol/HDL Ratio: 4 ratio (ref 0.0–4.4)
Cholesterol, Total: 277 mg/dL — ABNORMAL HIGH (ref 100–199)
HDL: 69 mg/dL (ref >39)
LDL CALC: 184 mg/dL — AB (ref 0–99)
TRIGLYCERIDES: 120 mg/dL (ref 0–149)
VLDL Cholesterol Cal: 24 mg/dL (ref 5–40)

## 2017-05-09 LAB — COMPREHENSIVE METABOLIC PANEL
A/G RATIO: 1.6 (ref 1.2–2.2)
ALT: 19 IU/L (ref 0–32)
AST: 19 IU/L (ref 0–40)
Albumin: 4.4 g/dL (ref 3.5–5.5)
Alkaline Phosphatase: 83 IU/L (ref 39–117)
BUN/Creatinine Ratio: 14 (ref 9–23)
BUN: 8 mg/dL (ref 6–24)
Bilirubin Total: 0.5 mg/dL (ref 0.0–1.2)
CALCIUM: 9.8 mg/dL (ref 8.7–10.2)
CHLORIDE: 100 mmol/L (ref 96–106)
CO2: 21 mmol/L (ref 20–29)
Creatinine, Ser: 0.59 mg/dL (ref 0.57–1.00)
GFR calc Af Amer: 123 mL/min/{1.73_m2} (ref >59)
GFR, EST NON AFRICAN AMERICAN: 106 mL/min/{1.73_m2} (ref >59)
Globulin, Total: 2.7 g/dL (ref 1.5–4.5)
Glucose: 110 mg/dL — ABNORMAL HIGH (ref 65–99)
POTASSIUM: 3.9 mmol/L (ref 3.5–5.2)
Sodium: 139 mmol/L (ref 134–144)
Total Protein: 7.1 g/dL (ref 6.0–8.5)

## 2017-05-09 LAB — CBC
Hematocrit: 47.5 % — ABNORMAL HIGH (ref 34.0–46.6)
Hemoglobin: 16.1 g/dL — ABNORMAL HIGH (ref 11.1–15.9)
MCH: 35.4 pg — ABNORMAL HIGH (ref 26.6–33.0)
MCHC: 33.9 g/dL (ref 31.5–35.7)
MCV: 104 fL — AB (ref 79–97)
PLATELETS: 320 10*3/uL (ref 150–379)
RBC: 4.55 x10E6/uL (ref 3.77–5.28)
RDW: 12.9 % (ref 12.3–15.4)
WBC: 5.7 10*3/uL (ref 3.4–10.8)

## 2017-05-09 LAB — TSH: TSH: 2.17 u[IU]/mL (ref 0.450–4.500)

## 2017-05-20 ENCOUNTER — Encounter: Payer: Self-pay | Admitting: Adult Health

## 2017-05-20 ENCOUNTER — Telehealth: Payer: Self-pay | Admitting: Adult Health

## 2017-05-20 DIAGNOSIS — E7800 Pure hypercholesterolemia, unspecified: Secondary | ICD-10-CM

## 2017-05-20 HISTORY — DX: Pure hypercholesterolemia, unspecified: E78.00

## 2017-05-20 NOTE — Telephone Encounter (Signed)
Pt aware of labs and pap, try to exercise more

## 2017-06-17 ENCOUNTER — Ambulatory Visit: Payer: BLUE CROSS/BLUE SHIELD | Admitting: Adult Health

## 2017-06-18 ENCOUNTER — Other Ambulatory Visit: Payer: Self-pay | Admitting: Adult Health

## 2017-06-18 DIAGNOSIS — Z1231 Encounter for screening mammogram for malignant neoplasm of breast: Secondary | ICD-10-CM

## 2017-06-22 ENCOUNTER — Other Ambulatory Visit: Payer: Self-pay | Admitting: Adult Health

## 2017-06-24 ENCOUNTER — Ambulatory Visit (HOSPITAL_COMMUNITY)
Admission: RE | Admit: 2017-06-24 | Discharge: 2017-06-24 | Disposition: A | Discharge status: Home / Self Care | Payer: BLUE CROSS/BLUE SHIELD | Source: Ambulatory Visit | Attending: Adult Health | Admitting: Adult Health

## 2017-06-24 ENCOUNTER — Encounter (HOSPITAL_COMMUNITY): Payer: Self-pay

## 2017-06-24 DIAGNOSIS — Z1231 Encounter for screening mammogram for malignant neoplasm of breast: Secondary | ICD-10-CM | POA: Insufficient documentation

## 2017-08-26 DIAGNOSIS — T1490XA Injury, unspecified, initial encounter: Secondary | ICD-10-CM | POA: Diagnosis not present

## 2017-08-26 DIAGNOSIS — B029 Zoster without complications: Secondary | ICD-10-CM | POA: Diagnosis not present

## 2017-09-07 ENCOUNTER — Telehealth: Payer: Self-pay | Admitting: Adult Health

## 2017-09-07 MED ORDER — TRIAMTERENE-HCTZ 37.5-25 MG PO CAPS: ORAL_CAPSULE | ORAL | 2 refills | Status: DC

## 2017-09-07 NOTE — Telephone Encounter (Signed)
Left message that refilled BP meds

## 2018-03-04 ENCOUNTER — Telehealth: Payer: Self-pay | Admitting: Adult Health

## 2018-03-04 MED ORDER — ESOMEPRAZOLE MAGNESIUM 40 MG PO CPDR: 40.0000 mg | DELAYED_RELEASE_CAPSULE | Freq: Every day | ORAL | 11 refills | Status: DC

## 2018-03-04 NOTE — Telephone Encounter (Signed)
Spoke with pt. Pt is requesting Nexium. Has been on this before. Thanks!! [place]

## 2018-03-04 NOTE — Telephone Encounter (Signed)
Refilled nexium 

## 2018-03-04 NOTE — Telephone Encounter (Signed)
Patient called stating that she would like for [facility] to call her in a medication for indigestion. Pt states that she uses [facility]. Please contact pt when done

## 2018-04-17 ENCOUNTER — Other Ambulatory Visit: Payer: Self-pay | Admitting: Adult Health

## 2018-06-05 ENCOUNTER — Other Ambulatory Visit: Payer: Self-pay | Admitting: Adult Health

## 2018-08-03 ENCOUNTER — Other Ambulatory Visit (HOSPITAL_COMMUNITY): Payer: Self-pay | Admitting: Adult Health

## 2018-08-03 DIAGNOSIS — Z1231 Encounter for screening mammogram for malignant neoplasm of breast: Secondary | ICD-10-CM

## 2018-08-11 ENCOUNTER — Other Ambulatory Visit: Payer: Self-pay

## 2018-08-11 ENCOUNTER — Ambulatory Visit (HOSPITAL_COMMUNITY)
Admission: RE | Admit: 2018-08-11 | Discharge: 2018-08-11 | Disposition: A | Discharge status: Home / Self Care | Payer: BC Managed Care – PPO | Source: Ambulatory Visit | Attending: Adult Health | Admitting: Adult Health

## 2018-08-11 DIAGNOSIS — Z1231 Encounter for screening mammogram for malignant neoplasm of breast: Secondary | ICD-10-CM | POA: Insufficient documentation

## 2019-01-21 ENCOUNTER — Other Ambulatory Visit: Payer: Self-pay | Admitting: Adult Health

## 2019-01-26 ENCOUNTER — Other Ambulatory Visit: Payer: Self-pay | Admitting: Adult Health

## 2019-02-28 ENCOUNTER — Other Ambulatory Visit: Payer: Self-pay | Admitting: Adult Health

## 2019-03-10 ENCOUNTER — Other Ambulatory Visit: Payer: Self-pay | Admitting: Adult Health

## 2019-04-24 ENCOUNTER — Other Ambulatory Visit: Payer: Self-pay | Admitting: Adult Health

## 2019-06-06 ENCOUNTER — Other Ambulatory Visit: Payer: Self-pay | Admitting: Adult Health

## 2019-08-22 ENCOUNTER — Other Ambulatory Visit: Payer: Self-pay | Admitting: Adult Health

## 2019-10-30 ENCOUNTER — Other Ambulatory Visit: Payer: Self-pay | Admitting: Adult Health

## 2019-12-09 ENCOUNTER — Other Ambulatory Visit: Payer: Self-pay | Admitting: Adult Health

## 2020-01-24 ENCOUNTER — Other Ambulatory Visit: Payer: Self-pay | Admitting: Adult Health

## 2020-03-10 ENCOUNTER — Other Ambulatory Visit: Payer: Self-pay | Admitting: Adult Health

## 2020-03-17 ENCOUNTER — Other Ambulatory Visit: Payer: Self-pay | Admitting: Adult Health

## 2020-03-21 ENCOUNTER — Other Ambulatory Visit (HOSPITAL_COMMUNITY)
Admission: RE | Admit: 2020-03-21 | Discharge: 2020-03-21 | Disposition: A | Discharge status: Home / Self Care | Payer: 59 | Source: Ambulatory Visit | Attending: Adult Health | Admitting: Adult Health

## 2020-03-21 ENCOUNTER — Other Ambulatory Visit: Payer: Self-pay

## 2020-03-21 ENCOUNTER — Ambulatory Visit (INDEPENDENT_AMBULATORY_CARE_PROVIDER_SITE_OTHER): Payer: 59 | Admitting: Adult Health

## 2020-03-21 ENCOUNTER — Encounter: Payer: Self-pay | Admitting: Adult Health

## 2020-03-21 VITALS — BP 140/90 | HR 100 | Ht 62.0 in | Wt 145.0 lb

## 2020-03-21 DIAGNOSIS — F32A Depression, unspecified: Secondary | ICD-10-CM

## 2020-03-21 DIAGNOSIS — Z1211 Encounter for screening for malignant neoplasm of colon: Secondary | ICD-10-CM | POA: Insufficient documentation

## 2020-03-21 DIAGNOSIS — I10 Essential (primary) hypertension: Secondary | ICD-10-CM

## 2020-03-21 DIAGNOSIS — E7800 Pure hypercholesterolemia, unspecified: Secondary | ICD-10-CM

## 2020-03-21 DIAGNOSIS — F17200 Nicotine dependence, unspecified, uncomplicated: Secondary | ICD-10-CM | POA: Insufficient documentation

## 2020-03-21 DIAGNOSIS — Z01419 Encounter for gynecological examination (general) (routine) without abnormal findings: Secondary | ICD-10-CM | POA: Insufficient documentation

## 2020-03-21 DIAGNOSIS — Z1231 Encounter for screening mammogram for malignant neoplasm of breast: Secondary | ICD-10-CM | POA: Insufficient documentation

## 2020-03-21 DIAGNOSIS — Z1212 Encounter for screening for malignant neoplasm of rectum: Secondary | ICD-10-CM

## 2020-03-21 LAB — HEMOCCULT GUIAC POC 1CARD (OFFICE): Fecal Occult Blood, POC: NEGATIVE

## 2020-03-21 MED ORDER — ESOMEPRAZOLE MAGNESIUM 40 MG PO CPDR: DELAYED_RELEASE_CAPSULE | ORAL | 12 refills | Status: DC

## 2020-03-21 MED ORDER — VENLAFAXINE HCL ER 75 MG PO CP24: 150.0000 mg | ORAL_CAPSULE | Freq: Every day | ORAL | 12 refills | Status: DC

## 2020-03-21 MED ORDER — TRIAMTERENE-HCTZ 37.5-25 MG PO CAPS: ORAL_CAPSULE | ORAL | 3 refills | Status: DC

## 2020-03-21 NOTE — Progress Notes (Addendum)
Patient ID: Allison Carr, female   DOB: [birth year], 55 y.o.   MRN: [id number] History of Present Illness: Allison Carr is a 55 year old white female, divorced, PM in for well woman gyn exam and pap. 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 And is going to have second grand child soon. PCP is [place].   Current Medications, Allergies, Past Medical History, Past Surgical History, Family History and Social History were reviewed in [employer] record.     Review of Systems:  Patient denies any headaches, hearing loss, fatigue, blurred vision, shortness of breath, chest pain, abdominal pain, problems with bowel movements, urination, or intercourse. No joint pain or mood swings.   Physical Exam:   BP 140/90 (BP Location: Left Arm, Patient Position: Sitting, Cuff Size: Normal)   Pulse 100   Ht 5\' 2"  (1.575 m)   Wt 145 lb (65.8 kg)   BMI 26.52 kg/m  General:  Well developed, well nourished, no acute distress Skin:  Warm and dry Neck:  Midline trachea, normal thyroid, good ROM, no lymphadenopathy Lungs; Clear to auscultation bilaterally Breast:  No dominant palpable mass, retraction, or nipple discharge,has implants Cardiovascular: Regular rate and rhythm Abdomen:  Soft, non tender, no hepatosplenomegaly Pelvic:  External genitalia is normal in appearance, has mole left labia.  The vagina is normal in appearance. Urethra has no lesions or masses. The cervix is smooth.pap with GC/CHL and HRHPV performed.   Uterus is felt to be normal size, shape, and contour.  No adnexal masses or tenderness noted.Bladder is non tender, no masses felt. Rectal: Good sphincter tone, no polyps, or hemorrhoids felt.  Hemoccult negative.Has skin tag left inner buttock  Extremities/musculoskeletal:  No swelling or varicosities noted, no clubbing or cyanosis Psych:  No mood changes, alert and cooperative,seems happy AA is 7  Fall risk is low PHQ 9 score is  0 is on effexor GAD 7 score is 3  Upstream - 03/21/20 1353      Pregnancy Intention Screening   Does the patient want to become pregnant in the next year? No    Does the patient's partner want to become pregnant in the next year? No    Would the patient like to discuss contraceptive options today? No      Contraception Wrap Up   Current Method Female Sterilization    End Method Female Sterilization    Contraception Counseling Provided No         Examination chaperoned by Levy Pupa LPN  Impression and Plan; 1. Encounter for gynecological examination with Papanicolaou smear of cervix Pap sent Physical in 1 year Pap in 3 if normal Check CBC,CMP,TSH and lipids Get mammogram this year Referred for colonoscopy   2. Encounter for screening fecal occult blood testing   3. Essential hypertension Continue dyazide Check CMP  4. Depression, unspecified depression type Continue effexor  5. Screening mammogram for breast cancer Get mammogram   6. Elevated cholesterol Check lipids  7. Encounter for colorectal cancer screening Referred to [facility] for colonoscopy   8. Smoker She is cutting back and not smoking in her car or house   Meds ordered this encounter  Medications  . venlafaxine XR (EFFEXOR-XR) 75 MG 24 hr capsule    Sig: Take 2 capsules (150 mg total) by mouth daily. TAKE 2 CAPSULES BY MOUTH ONCE DAILY.    Dispense:  60 capsule    Refill:  12  Order Specific Question:   Supervising Provider    Answer:   Tania Ade H [[id number]]  . triamterene-hydrochlorothiazide (DYAZIDE) 37.5-25 MG capsule    Sig: Take 1 daily    Dispense:  90 capsule    Refill:  3    Order Specific Question:   Supervising Provider    Answer:   EURE, LUTHER H [[id number]]  . esomeprazole (NEXIUM) 40 MG capsule    Sig: TAKE (1) CAPSULE BY MOUTH EVERY DAY AT NOON.    Dispense:  30 capsule    Refill:  12    Order Specific Question:   Supervising Provider    Answer:   Tania Ade H [[id number]]

## 2020-03-26 ENCOUNTER — Encounter: Payer: Self-pay | Admitting: Internal Medicine

## 2020-03-26 LAB — CYTOLOGY - PAP
Adequacy: ABSENT
Chlamydia: NEGATIVE
Comment: NEGATIVE
Comment: NEGATIVE
Comment: NORMAL
Diagnosis: NEGATIVE
High risk HPV: NEGATIVE
Neisseria Gonorrhea: NEGATIVE

## 2020-03-27 ENCOUNTER — Telehealth: Payer: Self-pay | Admitting: Adult Health

## 2020-03-27 MED ORDER — METRONIDAZOLE 0.75 % VA GEL: VAGINAL | 0 refills | Status: DC

## 2020-03-27 NOTE — Telephone Encounter (Signed)
Pt aware that pap is negative for malignancy and HPV and GC/Chlamydia but +BV will rx Metrogel, avoid alcohol

## 2020-03-29 LAB — LIPID PANEL
Chol/HDL Ratio: 3.4 ratio (ref 0.0–4.4)
Cholesterol, Total: 277 mg/dL — ABNORMAL HIGH (ref 100–199)
HDL: 82 mg/dL (ref >39)
LDL Chol Calc (NIH): 174 mg/dL — ABNORMAL HIGH (ref 0–99)
Triglycerides: 119 mg/dL (ref 0–149)
VLDL Cholesterol Cal: 21 mg/dL (ref 5–40)

## 2020-03-29 LAB — CBC
Hematocrit: 43.9 % (ref 34.0–46.6)
Hemoglobin: 15.6 g/dL (ref 11.1–15.9)
MCH: 35.5 pg — ABNORMAL HIGH (ref 26.6–33.0)
MCHC: 35.5 g/dL (ref 31.5–35.7)
MCV: 100 fL — ABNORMAL HIGH (ref 79–97)
Platelets: 281 10*3/uL (ref 150–450)
RBC: 4.39 x10E6/uL (ref 3.77–5.28)
RDW: 11.4 % — ABNORMAL LOW (ref 11.7–15.4)
WBC: 5 10*3/uL (ref 3.4–10.8)

## 2020-03-29 LAB — COMPREHENSIVE METABOLIC PANEL
ALT: 46 IU/L — ABNORMAL HIGH (ref 0–32)
AST: 67 IU/L — ABNORMAL HIGH (ref 0–40)
Albumin/Globulin Ratio: 1.7 (ref 1.2–2.2)
Albumin: 4.5 g/dL (ref 3.8–4.9)
Alkaline Phosphatase: 91 IU/L (ref 44–121)
BUN/Creatinine Ratio: 14 (ref 9–23)
BUN: 12 mg/dL (ref 6–24)
Bilirubin Total: 0.2 mg/dL (ref 0.0–1.2)
CO2: 23 mmol/L (ref 20–29)
Calcium: 9.6 mg/dL (ref 8.7–10.2)
Chloride: 93 mmol/L — ABNORMAL LOW (ref 96–106)
Creatinine, Ser: 0.86 mg/dL (ref 0.57–1.00)
GFR calc Af Amer: 89 mL/min/{1.73_m2} (ref >59)
GFR calc non Af Amer: 77 mL/min/{1.73_m2} (ref >59)
Globulin, Total: 2.7 g/dL (ref 1.5–4.5)
Glucose: 143 mg/dL — ABNORMAL HIGH (ref 65–99)
Potassium: 3.7 mmol/L (ref 3.5–5.2)
Sodium: 136 mmol/L (ref 134–144)
Total Protein: 7.2 g/dL (ref 6.0–8.5)

## 2020-03-29 LAB — TSH: TSH: 4.49 u[IU]/mL (ref 0.450–4.500)

## 2020-04-02 ENCOUNTER — Telehealth: Payer: Self-pay | Admitting: Adult Health

## 2020-04-02 DIAGNOSIS — E7800 Pure hypercholesterolemia, unspecified: Secondary | ICD-10-CM

## 2020-04-02 DIAGNOSIS — R739 Hyperglycemia, unspecified: Secondary | ICD-10-CM

## 2020-04-02 DIAGNOSIS — R748 Abnormal levels of other serum enzymes: Secondary | ICD-10-CM

## 2020-04-02 NOTE — Telephone Encounter (Signed)
Pt aware of labs will recheck cholesterol, CMP and A1c 05/28/20 Decrease alcohol and get labs fasting.

## 2020-04-19 ENCOUNTER — Other Ambulatory Visit: Payer: Self-pay

## 2020-04-19 ENCOUNTER — Ambulatory Visit: Payer: Self-pay

## 2020-04-19 ENCOUNTER — Encounter: Payer: Self-pay | Admitting: Internal Medicine

## 2020-08-04 ENCOUNTER — Other Ambulatory Visit: Payer: Self-pay | Admitting: Adult Health

## 2021-03-14 ENCOUNTER — Other Ambulatory Visit: Payer: Self-pay | Admitting: Adult Health

## 2021-04-02 ENCOUNTER — Other Ambulatory Visit (HOSPITAL_COMMUNITY): Payer: Self-pay | Admitting: Adult Health

## 2021-04-02 ENCOUNTER — Other Ambulatory Visit (HOSPITAL_COMMUNITY): Payer: Self-pay | Admitting: Obstetrics & Gynecology

## 2021-04-02 DIAGNOSIS — Z1231 Encounter for screening mammogram for malignant neoplasm of breast: Secondary | ICD-10-CM

## 2021-04-04 ENCOUNTER — Other Ambulatory Visit: Payer: Self-pay | Admitting: Adult Health

## 2021-04-08 ENCOUNTER — Other Ambulatory Visit: Payer: Self-pay

## 2021-04-08 ENCOUNTER — Ambulatory Visit (HOSPITAL_COMMUNITY)
Admission: RE | Admit: 2021-04-08 | Discharge: 2021-04-08 | Disposition: A | Discharge status: Home / Self Care | Payer: Self-pay | Source: Ambulatory Visit | Attending: Adult Health | Admitting: Adult Health

## 2021-04-08 DIAGNOSIS — Z1231 Encounter for screening mammogram for malignant neoplasm of breast: Secondary | ICD-10-CM | POA: Insufficient documentation

## 2021-05-05 ENCOUNTER — Other Ambulatory Visit: Payer: Self-pay | Admitting: Adult Health

## 2021-05-07 ENCOUNTER — Encounter (INDEPENDENT_AMBULATORY_CARE_PROVIDER_SITE_OTHER): Payer: Self-pay | Admitting: *Deleted

## 2021-05-07 ENCOUNTER — Other Ambulatory Visit: Payer: Self-pay

## 2021-05-07 ENCOUNTER — Ambulatory Visit (INDEPENDENT_AMBULATORY_CARE_PROVIDER_SITE_OTHER): Payer: 59 | Admitting: Adult Health

## 2021-05-07 ENCOUNTER — Encounter: Payer: Self-pay | Admitting: Adult Health

## 2021-05-07 VITALS — BP 137/82 | HR 80 | Ht 62.0 in | Wt 151.0 lb

## 2021-05-07 DIAGNOSIS — I10 Essential (primary) hypertension: Secondary | ICD-10-CM | POA: Diagnosis not present

## 2021-05-07 DIAGNOSIS — E7800 Pure hypercholesterolemia, unspecified: Secondary | ICD-10-CM

## 2021-05-07 DIAGNOSIS — Z01419 Encounter for gynecological examination (general) (routine) without abnormal findings: Secondary | ICD-10-CM | POA: Diagnosis not present

## 2021-05-07 DIAGNOSIS — Z1211 Encounter for screening for malignant neoplasm of colon: Secondary | ICD-10-CM

## 2021-05-07 DIAGNOSIS — F32A Depression, unspecified: Secondary | ICD-10-CM | POA: Diagnosis not present

## 2021-05-07 DIAGNOSIS — Z1212 Encounter for screening for malignant neoplasm of rectum: Secondary | ICD-10-CM

## 2021-05-07 DIAGNOSIS — L918 Other hypertrophic disorders of the skin: Secondary | ICD-10-CM

## 2021-05-07 DIAGNOSIS — R739 Hyperglycemia, unspecified: Secondary | ICD-10-CM | POA: Insufficient documentation

## 2021-05-07 DIAGNOSIS — F17200 Nicotine dependence, unspecified, uncomplicated: Secondary | ICD-10-CM

## 2021-05-07 LAB — HEMOCCULT GUIAC POC 1CARD (OFFICE): Fecal Occult Blood, POC: NEGATIVE

## 2021-05-07 MED ORDER — TRIAMTERENE-HCTZ 37.5-25 MG PO CAPS: ORAL_CAPSULE | ORAL | 4 refills | Status: DC

## 2021-05-07 MED ORDER — ESOMEPRAZOLE MAGNESIUM 40 MG PO CPDR: DELAYED_RELEASE_CAPSULE | ORAL | 12 refills | Status: DC

## 2021-05-07 MED ORDER — VENLAFAXINE HCL ER 75 MG PO CP24: ORAL_CAPSULE | ORAL | 4 refills | Status: DC

## 2021-05-07 NOTE — Progress Notes (Signed)
Patient ID: Allison Carr, female   DOB: 06/15/1965, 56 y.o.   MRN: [id number] ?History of Present Illness: ?Allison Carr is a 56 year old white female, divorced, PM in for a well woman gyn exam. ?Lab Results  ?Component Value Date  ? DIAGPAP  03/21/2020  ?  - Negative for intraepithelial lesion or malignancy (NILM)  ? HPV NOT DETECTED 05/05/2017  ? [place] Negative 03/21/2020  ? PCP is [place]. ? ? ?Current Medications, Allergies, Past Medical History, Past Surgical History, Family History and Social History were reviewed in [employer] record.   ? ? ?Review of Systems: ?Patient denies any headaches, hearing loss, fatigue, blurred vision, shortness of breath, chest pain, abdominal pain, problems with bowel movements, urination, or intercourse. No joint pain or mood swings.  ?Denies any vaginal bleeding ? ?Physical Exam:BP 137/82 (BP Location: Right Arm, Patient Position: Sitting, Cuff Size: Normal)   Pulse 80   Ht '5\' 2"'$  (1.575 m)   Wt 151 lb (68.5 kg)   BMI 27.62 kg/m?   ?General:  Well developed, well nourished, no acute distress ?Skin:  Warm and dry ?Neck:  Midline trachea, normal thyroid, good ROM, no lymphadenopathy ?Lungs; Clear to auscultation bilaterally ?Breast:  No dominant palpable mass, retraction, or nipple discharge,has bilateral implants ?Cardiovascular: Regular rate and rhythm ?Abdomen:  Soft, non tender, no hepatosplenomegaly ?Pelvic:  External genitalia is normal in appearance, no lesions.  The vagina is pale with loss of rugae. Urethra has no lesions or masses. The cervix is smooth.  Uterus is felt to be normal size, shape, and contour.  No adnexal masses or tenderness noted.Bladder is non tender, no masses felt. ?Rectal: Good sphincter tone, no polyps, or hemorrhoids felt.  Hemoccult negative.skin tag left inner buttock ?Extremities/musculoskeletal:  No swelling or varicosities noted, no clubbing or cyanosis ?Psych:  No mood changes, alert and cooperative,seems happy ?AA is  4 ?Fall risk is low ?Depression screen [facility] 2/9 05/07/2021 03/21/2020 05/05/2017  ?Decreased Interest 0 0 0  ?Down, Depressed, Hopeless 0 0 1  ?PHQ - 2 Score 0 0 1  ?Altered sleeping 0 0 -  ?Tired, decreased energy 0 0 -  ?Change in appetite 0 0 -  ?Feeling bad or failure about yourself  0 0 -  ?Trouble concentrating 0 0 -  ?Moving slowly or fidgety/restless 0 0 -  ?Suicidal thoughts 0 0 -  ?PHQ-9 Score 0 0 -  ? On effexor ?GAD 7 : Generalized Anxiety Score 05/07/2021 03/21/2020  ?Nervous, Anxious, on Edge 0 0  ?Control/stop worrying 0 1  ?Worry too much - different things 0 1  ?Trouble relaxing 0 1  ?Restless 0 0  ?Easily annoyed or irritable 0 0  ?Afraid - awful might happen 0 0  ?Total GAD 7 Score 0 3  ? ? [facility] - 05/07/21 1431   ? ?  ? Pregnancy Intention Screening  ? Does the patient want to become pregnant in the next year? N/A   ? Does the patient's partner want to become pregnant in the next year? N/A   ? Would the patient like to discuss contraceptive options today? N/A   ?  ? Contraception Wrap Up  ? Current Method Female Sterilization   ? End Method Female Sterilization   ? Contraception Counseling Provided No   ? ?  ?  ? ?  ? Examination chaperoned by Celene Squibb LPN ? ?  ? ?Impression and Plan: ?1. Encounter for well woman exam with routine gynecological exam ?Physical  in 1 year ?Pap 2025 ?Mammogram yearly ?Will check labs ?- CBC ?- Comprehensive metabolic panel ?- TSH ?- Lipid panel ? ?2. Encounter for screening fecal occult blood testin ? ?3. Essential hypertension ?Continue dyazide ? ?- Comprehensive metabolic panel ? ?4. Depression, unspecified depression type ?Continue Effexor ?Meds ordered this encounter  ?Medications  ? triamterene-hydrochlorothiazide (DYAZIDE) 37.5-25 MG capsule  ?  Sig: Take 1 daily  ?  Dispense:  90 capsule  ?  Refill:  4  ?  Order Specific Question:   Supervising Provider  ?  Answer:   Tania Ade H [[id number]]  ? venlafaxine XR (EFFEXOR-XR) 75 MG 24 hr capsule  ?  Sig: TAKE (2)  CAPSULES BY MOUTH ONCE DAILY.  ?  Dispense:  90 capsule  ?  Refill:  4  ?  Order Specific Question:   Supervising Provider  ?  Answer:   Tania Ade H [[id number]]  ? esomeprazole (NEXIUM) 40 MG capsule  ?  Sig: Take 1 in am  ?  Dispense:  30 capsule  ?  Refill:  12  ?  Order Specific Question:   Supervising Provider  ?  Answer:   Tania Ade H [[id number]]  ?  ? ? ?5. Smoker ? ?6. Screening for colorectal cancer ?Referred to Dr Jenetta Downer  ?- Ambulatory referral to Gastroenterology ? ?7. Elevated cholesterol ?- Lipid panel ? ?8. Elevated blood sugar ? ?- Hemoglobin A1c ? ?9. Skin tag ?Return in 4 weeks for skin tag removal ? ? ? ?  ?  ?

## 2021-05-08 LAB — COMPREHENSIVE METABOLIC PANEL
ALT: 16 IU/L (ref 0–32)
AST: 19 IU/L (ref 0–40)
Albumin/Globulin Ratio: 2 (ref 1.2–2.2)
Albumin: 4.7 g/dL (ref 3.8–4.9)
Alkaline Phosphatase: 92 IU/L (ref 44–121)
BUN/Creatinine Ratio: 21 (ref 9–23)
BUN: 13 mg/dL (ref 6–24)
Bilirubin Total: 0.3 mg/dL (ref 0.0–1.2)
CO2: 24 mmol/L (ref 20–29)
Calcium: 9.9 mg/dL (ref 8.7–10.2)
Chloride: 97 mmol/L (ref 96–106)
Creatinine, Ser: 0.62 mg/dL (ref 0.57–1.00)
Globulin, Total: 2.3 g/dL (ref 1.5–4.5)
Glucose: 91 mg/dL (ref 70–99)
Potassium: 4.2 mmol/L (ref 3.5–5.2)
Sodium: 138 mmol/L (ref 134–144)
Total Protein: 7 g/dL (ref 6.0–8.5)
eGFR: 105 mL/min/{1.73_m2} (ref >59)

## 2021-05-08 LAB — LIPID PANEL
Chol/HDL Ratio: 4.7 ratio — ABNORMAL HIGH (ref 0.0–4.4)
Cholesterol, Total: 368 mg/dL — ABNORMAL HIGH (ref 100–199)
HDL: 78 mg/dL (ref >39)
LDL Chol Calc (NIH): 241 mg/dL — ABNORMAL HIGH (ref 0–99)
Triglycerides: 242 mg/dL — ABNORMAL HIGH (ref 0–149)
VLDL Cholesterol Cal: 49 mg/dL — ABNORMAL HIGH (ref 5–40)

## 2021-05-08 LAB — CBC
Hematocrit: 44.5 % (ref 34.0–46.6)
Hemoglobin: 15.4 g/dL (ref 11.1–15.9)
MCH: 34.5 pg — ABNORMAL HIGH (ref 26.6–33.0)
MCHC: 34.6 g/dL (ref 31.5–35.7)
MCV: 100 fL — ABNORMAL HIGH (ref 79–97)
Platelets: 293 10*3/uL (ref 150–450)
RBC: 4.46 x10E6/uL (ref 3.77–5.28)
RDW: 11.4 % — ABNORMAL LOW (ref 11.7–15.4)
WBC: 6.6 10*3/uL (ref 3.4–10.8)

## 2021-05-08 LAB — HEMOGLOBIN A1C
Est. average glucose Bld gHb Est-mCnc: 108 mg/dL
Hgb A1c MFr Bld: 5.4 % (ref 4.8–5.6)

## 2021-05-08 LAB — TSH: TSH: 2.68 u[IU]/mL (ref 0.450–4.500)

## 2021-05-10 ENCOUNTER — Telehealth: Payer: Self-pay | Admitting: Adult Health

## 2021-05-10 MED ORDER — SIMVASTATIN 20 MG PO TABS: 20.0000 mg | ORAL_TABLET | Freq: Every day | ORAL | 6 refills | Status: DC

## 2021-05-10 NOTE — Telephone Encounter (Signed)
Reviewed labs with [facility], and will start on zocor 20 mg 1 daily and watch fats and carbs and try to walk more, will recheck labs in 3- 6 months  ?

## 2021-06-11 ENCOUNTER — Other Ambulatory Visit (HOSPITAL_COMMUNITY)
Admission: RE | Admit: 2021-06-11 | Discharge: 2021-06-11 | Disposition: A | Discharge status: Home / Self Care | Payer: 59 | Source: Ambulatory Visit | Attending: Adult Health | Admitting: Adult Health

## 2021-06-11 ENCOUNTER — Encounter: Payer: Self-pay | Admitting: Adult Health

## 2021-06-11 ENCOUNTER — Ambulatory Visit (INDEPENDENT_AMBULATORY_CARE_PROVIDER_SITE_OTHER): Payer: 59 | Admitting: Adult Health

## 2021-06-11 VITALS — BP 152/87 | HR 102 | Ht 62.0 in | Wt 148.5 lb

## 2021-06-11 DIAGNOSIS — L918 Other hypertrophic disorders of the skin: Secondary | ICD-10-CM

## 2021-06-11 NOTE — Progress Notes (Signed)
?  Subjective:  ?  ? Patient ID: Allison Carr, female   DOB: [birth year], 56 y.o.   MRN: [id number] ? ?HPI ?Allison Carr is a 56 year old white female, divorced, PM in for skin tag removal.  ?Lab Results  ?Component Value Date  ? DIAGPAP  03/21/2020  ?  - Negative for intraepithelial lesion or malignancy (NILM)  ? HPV NOT DETECTED 05/05/2017  ? [place] Negative 03/21/2020  ? PCP is [place].  ? ?Review of Systems ?For skin tag removal  ?Reviewed past medical,surgical, social and family history. Reviewed medications and allergies.  ?   ?Objective:  ? Physical Exam ?BP (!) 152/87 (BP Location: Left Arm, Patient Position: Sitting, Cuff Size: Normal)   Pulse (!) 102   Ht '5\' 2"'$  (1.575 m)   Wt 148 lb 8 oz (67.4 kg)   BMI 27.16 kg/m?   ?  Consent signed. Time out called. ?Skin warm and dry.External genitalia is normal in appearance, has 1 cm skin tag left inner buttock, cleansed with alcohol and injected with 2 cc 1% lidocaine, waited till numb, hemostats applied at base, then removed and skin tag excised with scissors, no bleeding noted, put folded 4x4 over it. ?Skin tag sent to pathology.  ?Examination chaperoned by Balinda Quails NP student.  ?Fall risk is low ? Up[facility] - 06/11/21 1401   ? ?  ? Pregnancy Intention Screening  ? Does the patient want to become pregnant in the next year? N/A   ? Does the patient's partner want to become pregnant in the next year? N/A   ? Would the patient like to discuss contraceptive options today? N/A   ?  ? Contraception Wrap Up  ? Current Method Female Sterilization   ? End Method Female Sterilization   ? Contraception Counseling Provided No   ? ?  ?  ? ?  ?  ?Assessment:  ?   ?Skin tag removed ?Skin tag specimen sent to pathology. ?Keep area clean and dry ?   ?Plan:  ?   ?Follow up prn  ?   ?

## 2021-06-11 NOTE — Addendum Note (Signed)
Addended by: Linton Rump on: 06/11/2021 02:29 PM ? ? Modules accepted: Orders ? ?

## 2021-06-13 LAB — SURGICAL PATHOLOGY

## 2021-06-14 ENCOUNTER — Telehealth: Payer: Self-pay | Admitting: *Deleted

## 2021-06-14 NOTE — Telephone Encounter (Signed)
Pt aware that the area that was removed was a wart. Pt voiced understanding. JSY ?

## 2021-06-14 NOTE — Telephone Encounter (Signed)
-----   Message from Estill Dooms, NP sent at 06/14/2021  8:52 AM EDT ----- ?Let pt know that area removed was a wart. THX ?

## 2021-06-19 ENCOUNTER — Ambulatory Visit
Admission: EM | Admit: 2021-06-19 | Discharge: 2021-06-19 | Disposition: A | Discharge status: Home / Self Care | Payer: 59 | Attending: Nurse Practitioner | Admitting: Nurse Practitioner

## 2021-06-19 DIAGNOSIS — R21 Rash and other nonspecific skin eruption: Secondary | ICD-10-CM

## 2021-06-19 MED ORDER — METHYLPREDNISOLONE SODIUM SUCC 125 MG IJ SOLR
80.0000 mg | Freq: Once | INTRAMUSCULAR | Status: AC
  Administered 2021-06-19: 80 mg via INTRAMUSCULAR

## 2021-06-19 MED ORDER — PREDNISONE 10 MG (21) PO TBPK: ORAL_TABLET | Freq: Every day | ORAL | 0 refills | Status: DC

## 2021-06-19 MED ORDER — FAMOTIDINE 20 MG PO TABS: 20.0000 mg | ORAL_TABLET | Freq: Every day | ORAL | 0 refills | Status: DC

## 2021-06-19 MED ORDER — CETIRIZINE HCL 10 MG PO TABS: 10.0000 mg | ORAL_TABLET | Freq: Every day | ORAL | 0 refills | Status: DC

## 2021-06-19 NOTE — Discharge Instructions (Addendum)
Start your prednisone on 06/20/2021. ?Take medicine as prescribed. ?Try not to scratch or irritate the affected areas while symptoms persist. ?Avoid hot baths or showers while symptoms persist. ?If you have a bathtub at home, recommend Aveeno colloidal oatmeal bath to help with itching. ?May take Benadryl 25 to 50 mg at bedtime as needed for itching. ?Follow-up if you have worsening rash.  If you develop tongue swelling or throat swelling, go to the ER immediately. ?Follow-up as needed. ?

## 2021-06-19 NOTE — ED Triage Notes (Signed)
Onset early this morning of pruritic rash that started on her BUE and has since generally spread. ?Has been taking benadryl with some relief. Pt ?

## 2021-06-19 NOTE — ED Provider Notes (Signed)
?[facility] ? ? ? ?CSN: [id number] ?Arrival date & time: 06/19/21  1111 ? ? ?  ? ?History   ?Chief Complaint ?Chief Complaint  ?Patient presents with  ? Rash  ? ? ?HPI ?Allison Carr is a 56 y.o. female.  ? ?The patient is a 56 year old female who presents with a rash.  Symptoms started this morning around 3 AM.  Patient states she was outside in the yard 1 day ago working.  She cannot confirm if it was an insect bite, but states when this occurred last she developed symptoms immediately.  Today she presents with a pruritic rash on her back, buttocks, torso, and bilateral upper extremities.  She denies any chest pain, shortness of breath, difficulty breathing, tongue swelling or throat swelling.  She has been taking Benadryl for her symptoms with minimal relief. ? ?The history is provided by the patient.  ? ?Past Medical History:  ?Diagnosis Date  ? Anxiety   ? Depression   ? Elevated cholesterol 05/20/2017  ? GERD (gastroesophageal reflux disease)   ? Hypertension   ? Numerous moles 06/01/2013  ? Refer to Dr Tarri Glenn to check moles  ? Vitamin D deficiency 06/28/2015  ? ? ?Patient Active Problem List  ? Diagnosis Date Noted  ? Encounter for well woman exam with routine gynecological exam 05/07/2021  ? Skin tag 05/07/2021  ? Elevated blood sugar 05/07/2021  ? Screening for colorectal cancer 05/07/2021  ? Encounter for screening fecal occult blood testing 03/21/2020  ? Screening mammogram for breast cancer 03/21/2020  ? Smoker 03/21/2020  ? Encounter for colorectal cancer screening 03/21/2020  ? Elevated cholesterol 05/20/2017  ? Encounter for gynecological examination with Papanicolaou smear of cervix 05/05/2017  ? Vitamin D deficiency 06/28/2015  ? Essential hypertension 06/01/2013  ? Numerous moles 06/01/2013  ? Depression 06/01/2013  ? ? ?Past Surgical History:  ?Procedure Laterality Date  ? AUGMENTATION MAMMAPLASTY Bilateral   ? ENDOMETRIAL ABLATION    ? TUBAL LIGATION    ? ? ?OB History   ? ? Gravida  ?2  ?  Para  ?2  ? Term  ?2  ? Preterm  ?   ? AB  ?   ? Living  ?2  ?  ? ? SAB  ?   ? IAB  ?   ? Ectopic  ?   ? Multiple  ?   ? Live Births  ?2  ?   ?  ?  ? ? ? ?Home Medications   ? ?Prior to Admission medications   ?Medication Sig Start Date End Date Taking? Authorizing Provider  ?esomeprazole (NEXIUM) 40 MG capsule Take 1 in am 05/07/21   Derrek Monaco A, NP  ?simvastatin (ZOCOR) 20 MG tablet Take 1 tablet (20 mg total) by mouth daily. 05/10/21   Estill Dooms, NP  ?triamterene-hydrochlorothiazide (DYAZIDE) 37.5-25 MG capsule Take 1 daily 05/07/21   Derrek Monaco A, NP  ?venlafaxine XR (EFFEXOR-XR) 75 MG 24 hr capsule TAKE (2) CAPSULES BY MOUTH ONCE DAILY. 05/07/21   Estill Dooms, NP  ? ? ?Family History ?Family History  ?Problem Relation Age of Onset  ? Cancer Mother   ?     breast  ? Diabetes Mother   ? Kidney disease Father   ? Heart disease Father   ? Heart disease Paternal Grandmother   ? Heart disease Paternal Grandfather   ? ? ?Social History ?Social History  ? ?Tobacco Use  ? Smoking status: Every Day  ?  Packs/day: 1.00  ?  Years: 20.00  ?  Pack years: 20.00  ?  Types: Cigarettes  ? Smokeless tobacco: Never  ?Vaping Use  ? Vaping Use: Some days  ?Substance Use Topics  ? Alcohol use: Yes  ?  Comment: occassionally  ? Drug use: No  ? ? ? ?Allergies   ?Patient has no known allergies. ? ? ?Review of Systems ?Review of Systems  ?Constitutional: Negative.   ?Respiratory: Negative.    ?Cardiovascular: Negative.   ?Gastrointestinal: Negative.   ?Skin:  Positive for rash.  ?Psychiatric/Behavioral: Negative.    ? ? ?Physical Exam ?Triage Vital Signs ?ED Triage Vitals [06/19/21 1124]  ?Enc Vitals Group  ?   BP 127/81  ?   Pulse Rate 93  ?   Resp 18  ?   Temp 98.9 ?F (37.2 ?C)  ?   Temp Source Oral  ?   SpO2 95 %  ?   Weight   ?   Height   ?   Head Circumference   ?   Peak Flow   ?   Pain Score 0  ?   Pain Loc   ?   Pain Edu?   ?   Excl. in [place]?   ? ?No data found. ? ?Updated Vital Signs ?BP 127/81 (BP  Location: Right Arm)   Pulse 93   Temp 98.9 ?F (37.2 ?C) (Oral)   Resp 18   SpO2 95%  ? ?Visual Acuity ?Right Eye Distance:   ?Left Eye Distance:   ?Bilateral Distance:   ? ?Right Eye Near:   ?Left Eye Near:    ?Bilateral Near:    ? ?Physical Exam ?Vitals reviewed.  ?HENT:  ?   Head: Normocephalic.  ?   Right Ear: Tympanic membrane, ear canal and external ear normal.  ?   Left Ear: Tympanic membrane, ear canal and external ear normal.  ?   Mouth/Throat:  ?   Mouth: Mucous membranes are moist.  ?Eyes:  ?   Extraocular Movements: Extraocular movements intact.  ?   Conjunctiva/sclera: Conjunctivae normal.  ?   Pupils: Pupils are equal, round, and reactive to light.  ?Cardiovascular:  ?   Rate and Rhythm: Normal rate and regular rhythm.  ?   Pulses: Normal pulses.  ?   Heart sounds: Normal heart sounds.  ?Pulmonary:  ?   Effort: Pulmonary effort is normal.  ?   Breath sounds: Normal breath sounds.  ?Abdominal:  ?   General: Bowel sounds are normal.  ?   Palpations: Abdomen is soft.  ?Musculoskeletal:  ?   Cervical back: Normal range of motion.  ?Skin: ?   General: Skin is warm and dry.  ?   Findings: Rash present. Rash is macular, papular and urticarial.  ?   Comments: Maculopapular rash to abdomen, trunk, buttocks, lower back and bilateral upper extremities. No congruent pattern observed.  ?Neurological:  ?   General: No focal deficit present.  ?   Mental Status: She is alert and oriented to person, place, and time.  ?Psychiatric:     ?   Mood and Affect: Mood normal.     ?   Behavior: Behavior normal.  ? ? ? ?UC Treatments / Results  ?Labs ?(all labs ordered are listed, but only abnormal results are displayed) ?Labs Reviewed - No data to display ? ?EKG ? ? ?Radiology ?No results found. ? ?Procedures ?Procedures (including critical care time) ? ?Medications Ordered in UC ?Medications - No data to  display ? ?Initial Impression / Assessment and Plan / UC Course  ?I have reviewed the triage vital signs and the nursing  notes. ? ?Pertinent labs & imaging results that were available during my care of the patient were reviewed by me and considered in my medical decision making (see chart for details). ? ?The patient is a 56 year old female who presents for rash.  Patient states she was working in her yard the day prior and then developed a rash earlier today.  On exam, her rash is maculopapular, with no congruent pattern, the rash is located on her torso, back, bilateral upper extremities, and abdomen.  The rash is slightly raised and erythematous.  Her symptoms are consistent with a contact dermatitis, patient cannot identify the exact cause, we will start the patient on a prednisone taper today along with famotidine and cetirizine.  Patient advised to provide supportive care as she can use Aveeno colloidal oatmeal bath to help with the itching.  Patient advised to follow-up if she has worsening rash or itching, she was advised to go to the ER if she develops tongue swelling, throat swelling or other concerns. ?Final Clinical Impressions(s) / UC Diagnoses  ? ?Final diagnoses:  ?None  ? ?Discharge Instructions   ?None ?  ? ?ED Prescriptions   ?None ?  ? ?PDMP not reviewed this encounter. ?  ?Tish Men, NP ?06/19/21 1333 ? ?

## 2022-01-05 ENCOUNTER — Other Ambulatory Visit: Payer: Self-pay | Admitting: Adult Health

## 2022-01-07 ENCOUNTER — Other Ambulatory Visit: Payer: Self-pay | Admitting: Adult Health

## 2022-02-06 ENCOUNTER — Other Ambulatory Visit: Payer: Self-pay | Admitting: Adult Health

## 2022-03-11 ENCOUNTER — Other Ambulatory Visit: Payer: Self-pay | Admitting: Adult Health

## 2022-04-09 ENCOUNTER — Other Ambulatory Visit: Payer: Self-pay | Admitting: Adult Health

## 2022-05-06 ENCOUNTER — Other Ambulatory Visit: Payer: Self-pay | Admitting: Adult Health

## 2022-06-06 ENCOUNTER — Other Ambulatory Visit: Payer: Self-pay | Admitting: Adult Health

## 2022-08-07 ENCOUNTER — Other Ambulatory Visit: Payer: Self-pay | Admitting: Adult Health

## 2022-08-08 ENCOUNTER — Other Ambulatory Visit: Payer: Self-pay | Admitting: Adult Health

## 2022-08-11 ENCOUNTER — Other Ambulatory Visit: Payer: Self-pay | Admitting: Adult Health

## 2022-08-11 DIAGNOSIS — E7800 Pure hypercholesterolemia, unspecified: Secondary | ICD-10-CM

## 2022-08-11 NOTE — Progress Notes (Signed)
Ck lipids

## 2022-08-15 ENCOUNTER — Other Ambulatory Visit (HOSPITAL_COMMUNITY): Payer: Self-pay | Admitting: Adult Health

## 2022-08-15 DIAGNOSIS — Z1231 Encounter for screening mammogram for malignant neoplasm of breast: Secondary | ICD-10-CM

## 2022-08-28 ENCOUNTER — Ambulatory Visit (HOSPITAL_COMMUNITY)
Admission: RE | Admit: 2022-08-28 | Discharge: 2022-08-28 | Disposition: A | Discharge status: Home / Self Care | Payer: 59 | Source: Ambulatory Visit | Attending: Adult Health | Admitting: Adult Health

## 2022-08-28 DIAGNOSIS — Z1231 Encounter for screening mammogram for malignant neoplasm of breast: Secondary | ICD-10-CM | POA: Diagnosis not present

## 2022-09-05 ENCOUNTER — Other Ambulatory Visit: Payer: Self-pay | Admitting: Adult Health

## 2022-09-24 ENCOUNTER — Telehealth: Payer: Self-pay

## 2022-09-24 MED ORDER — AZITHROMYCIN 250 MG PO TABS: ORAL_TABLET | ORAL | 0 refills | Status: DC

## 2022-09-24 NOTE — Telephone Encounter (Signed)
Patient called and stated that she has a sinus infection and wants to know if Victorino Dike can call her something in.

## 2022-09-24 NOTE — Telephone Encounter (Signed)
Pt has drainage in the back of throat. Sinus headache. Going on vacation next week and don't want to be sick. Pt was also advised to get labs done before her appt here. Pt voiced understanding. JSY

## 2022-09-24 NOTE — Telephone Encounter (Signed)
Rx sent in for Zpack

## 2022-10-02 ENCOUNTER — Other Ambulatory Visit: Payer: Self-pay | Admitting: Adult Health

## 2022-10-07 ENCOUNTER — Ambulatory Visit: Payer: Self-pay | Admitting: Adult Health

## 2022-11-05 ENCOUNTER — Other Ambulatory Visit: Payer: Self-pay | Admitting: *Deleted

## 2022-11-05 MED ORDER — SIMVASTATIN 20 MG PO TABS: 20.0000 mg | ORAL_TABLET | Freq: Every day | ORAL | 0 refills | Status: DC

## 2022-11-05 MED ORDER — ESOMEPRAZOLE MAGNESIUM 40 MG PO CPDR: DELAYED_RELEASE_CAPSULE | ORAL | 0 refills | Status: DC

## 2022-11-12 ENCOUNTER — Other Ambulatory Visit (HOSPITAL_COMMUNITY)
Admission: RE | Admit: 2022-11-12 | Discharge: 2022-11-12 | Disposition: A | Discharge status: Home / Self Care | Payer: 59 | Source: Ambulatory Visit | Attending: Adult Health | Admitting: Adult Health

## 2022-11-12 ENCOUNTER — Ambulatory Visit (INDEPENDENT_AMBULATORY_CARE_PROVIDER_SITE_OTHER): Payer: 59 | Admitting: Adult Health

## 2022-11-12 ENCOUNTER — Encounter: Payer: Self-pay | Admitting: Adult Health

## 2022-11-12 VITALS — BP 163/91 | HR 78 | Ht 62.0 in | Wt 149.0 lb

## 2022-11-12 DIAGNOSIS — Z1211 Encounter for screening for malignant neoplasm of colon: Secondary | ICD-10-CM | POA: Diagnosis not present

## 2022-11-12 DIAGNOSIS — Z1212 Encounter for screening for malignant neoplasm of rectum: Secondary | ICD-10-CM

## 2022-11-12 DIAGNOSIS — F17200 Nicotine dependence, unspecified, uncomplicated: Secondary | ICD-10-CM | POA: Diagnosis not present

## 2022-11-12 DIAGNOSIS — I10 Essential (primary) hypertension: Secondary | ICD-10-CM | POA: Diagnosis not present

## 2022-11-12 DIAGNOSIS — Z01419 Encounter for gynecological examination (general) (routine) without abnormal findings: Secondary | ICD-10-CM | POA: Insufficient documentation

## 2022-11-12 DIAGNOSIS — F32A Depression, unspecified: Secondary | ICD-10-CM | POA: Diagnosis not present

## 2022-11-12 DIAGNOSIS — E7800 Pure hypercholesterolemia, unspecified: Secondary | ICD-10-CM

## 2022-11-12 LAB — HEMOCCULT GUIAC POC 1CARD (OFFICE): Fecal Occult Blood, POC: NEGATIVE

## 2022-11-12 MED ORDER — SIMVASTATIN 20 MG PO TABS: 20.0000 mg | ORAL_TABLET | Freq: Every day | ORAL | 3 refills | Status: DC

## 2022-11-12 MED ORDER — TRIAMTERENE-HCTZ 37.5-25 MG PO CAPS: ORAL_CAPSULE | ORAL | 4 refills | Status: DC

## 2022-11-12 MED ORDER — VENLAFAXINE HCL ER 150 MG PO CP24: ORAL_CAPSULE | ORAL | 3 refills | Status: DC

## 2022-11-12 NOTE — Progress Notes (Signed)
Patient ID: Allison Carr, female   DOB: 01-03-1966, 57 y.o.   MRN: [id number] History of Present Illness: Allison Carr is a 57 year old white female, divorced, PM in for a well woman gyn exam and pap. She is out of zocor and BP pills.  PCP is Dr Marletta Lor.   Current Medications, Allergies, Past Medical History, Past Surgical History, Family History and Social History were reviewed in [employer] record.     Review of Systems: Patient denies any headaches, hearing loss, fatigue, blurred vision, shortness of breath, chest pain, abdominal pain, problems with bowel movements, urination, or intercourse. No joint pain or mood swings.  Denies any vaginal bleeding   Physical Exam:BP (!) 163/91 (BP Location: Right Arm, Patient Position: Sitting, Cuff Size: Normal)   Pulse 78   Ht 5\' 2"  (1.575 m)   Wt 149 lb (67.6 kg)   BMI 27.25 kg/m   General:  Well developed, well nourished, no acute distress Skin:  Warm and dry Neck:  Midline trachea, normal thyroid, good ROM, no lymphadenopathy Lungs; Clear to auscultation bilaterally Breast:  No dominant palpable mass, retraction, or nipple discharge,has bilateral implants Cardiovascular: Regular rate and rhythm Abdomen:  Soft, non tender, no hepatosplenomegaly Pelvic:  External genitalia is normal in appearance, no lesions.  The vagina is normal in appearance. Urethra has no lesions or masses. The cervix is smooth, pap with HR HPV genotyping performed.  Uterus is felt to be normal size, shape, and contour.  No adnexal masses or tenderness noted.Bladder is non tender, no masses felt. Rectal: Good sphincter tone, no polyps, or hemorrhoids felt.  Hemoccult negative. Extremities/musculoskeletal:  No swelling or varicosities noted, no clubbing or cyanosis Psych:  No mood changes, alert and cooperative,seems happy AA is 4 Fall risk is low    11/12/2022    1:28 PM 05/07/2021    2:30 PM 03/21/2020    1:53 PM  Depression screen PHQ 2/9  Decreased  Interest 0 0 0  Down, Depressed, Hopeless 1 0 0  PHQ - 2 Score 1 0 0  Altered sleeping 0 0 0  Tired, decreased energy 1 0 0  Change in appetite 0 0 0  Feeling bad or failure about yourself  0 0 0  Trouble concentrating 0 0 0  Moving slowly or fidgety/restless 0 0 0  Suicidal thoughts 0 0 0  PHQ-9 Score 2 0 0       11/12/2022    1:29 PM 05/07/2021    2:30 PM 03/21/2020    1:54 PM  GAD 7 : Generalized Anxiety Score  Nervous, Anxious, on Edge 1 0 0  Control/stop worrying 0 0 1  Worry too much - different things 1 0 1  Trouble relaxing 0 0 1  Restless 0 0 0  Easily annoyed or irritable 1 0 0  Afraid - awful might happen 0 0 0  Total GAD 7 Score 3 0 3      Upstream - 11/12/22 1333       Pregnancy Intention Screening   Does the patient want to become pregnant in the next year? No    Does the patient's partner want to become pregnant in the next year? No    Would the patient like to discuss contraceptive options today? No      Contraception Wrap Up   Current Method Female Sterilization    End Method Female Sterilization    Contraception Counseling Provided No  Examination chaperoned by Malachy Mood LPN  Impression and Plan; 1. Encounter for routine gynecological examination with Papanicolaou smear of cervix Pap sent Pap in 3 years if normal Physical in 1 year Mammogram was negative 08/28/22 Stay active  - CBC - Cytology - PAP( [place])  2. Encounter for screening fecal occult blood testing Hemoccult was negative  - POCT occult blood stool  3. Essential hypertension Has been out of meds Refilled dyazide Keep check on BP  4. Depression, unspecified depression type Doing well will refill Effexor   Meds ordered this encounter  Medications   simvastatin (ZOCOR) 20 MG tablet    Sig: Take 1 tablet (20 mg total) by mouth daily.    Dispense:  90 tablet    Refill:  3    Order Specific Question:   Supervising Provider    Answer:   Lazaro Arms  [[id number]]   triamterene-hydrochlorothiazide (DYAZIDE) 37.5-25 MG capsule    Sig: Take 1 daily    Dispense:  90 capsule    Refill:  4    Order Specific Question:   Supervising Provider    Answer:   Duane Lope H [[id number]]   venlafaxine XR (EFFEXOR-XR) 150 MG 24 hr capsule    Sig: TAKE 1 CAPSULE BY MOUTH DAILY.    Dispense:  90 capsule    Refill:  3    Order Specific Question:   Supervising Provider    Answer:   Despina Hidden, LUTHER H [[id number]]     5. Smoker Still smoking   6. Screening for colorectal cancer Will order cologuard - Cologuard   7. Elevated cholesterol  Has order to check lipids and CMP from June   Refilled zocor 20 mg 1 daily

## 2022-11-13 ENCOUNTER — Telehealth: Payer: Self-pay | Admitting: *Deleted

## 2022-11-13 LAB — CBC
Hematocrit: 42.3 % (ref 34.0–46.6)
Hemoglobin: 14.5 g/dL (ref 11.1–15.9)
MCH: 35.5 pg — ABNORMAL HIGH (ref 26.6–33.0)
MCHC: 34.3 g/dL (ref 31.5–35.7)
MCV: 104 fL — ABNORMAL HIGH (ref 79–97)
Platelets: 287 10*3/uL (ref 150–450)
RBC: 4.08 x10E6/uL (ref 3.77–5.28)
RDW: 12.1 % (ref 11.7–15.4)
WBC: 6.7 10*3/uL (ref 3.4–10.8)

## 2022-11-13 LAB — COMPREHENSIVE METABOLIC PANEL
ALT: 23 IU/L (ref 0–32)
AST: 26 IU/L (ref 0–40)
Albumin: 4.3 g/dL (ref 3.8–4.9)
Alkaline Phosphatase: 125 IU/L — ABNORMAL HIGH (ref 44–121)
BUN/Creatinine Ratio: 20 (ref 9–23)
BUN: 11 mg/dL (ref 6–24)
Bilirubin Total: <0.2 mg/dL (ref 0.0–1.2)
CO2: 26 mmol/L (ref 20–29)
Calcium: 9.4 mg/dL (ref 8.7–10.2)
Chloride: 98 mmol/L (ref 96–106)
Creatinine, Ser: 0.55 mg/dL — ABNORMAL LOW (ref 0.57–1.00)
Globulin, Total: 2.3 g/dL (ref 1.5–4.5)
Glucose: 89 mg/dL (ref 70–99)
Potassium: 4 mmol/L (ref 3.5–5.2)
Sodium: 139 mmol/L (ref 134–144)
Total Protein: 6.6 g/dL (ref 6.0–8.5)
eGFR: 107 mL/min/{1.73_m2} (ref >59)

## 2022-11-13 LAB — LIPID PANEL
Chol/HDL Ratio: 5.4 ratio — ABNORMAL HIGH (ref 0.0–4.4)
Cholesterol, Total: 333 mg/dL — ABNORMAL HIGH (ref 100–199)
HDL: 62 mg/dL (ref >39)
LDL Chol Calc (NIH): 202 mg/dL — ABNORMAL HIGH (ref 0–99)
Triglycerides: 342 mg/dL — ABNORMAL HIGH (ref 0–149)
VLDL Cholesterol Cal: 69 mg/dL — ABNORMAL HIGH (ref 5–40)

## 2022-11-13 NOTE — Telephone Encounter (Signed)
Left message @ 10:04 am. JSY

## 2022-11-13 NOTE — Telephone Encounter (Signed)
-----   Message from Cyril Mourning sent at 11/13/2022  8:41 AM EDT ----- Let pt know about cholesterol, and labs in 4 months, THX

## 2022-11-13 NOTE — Telephone Encounter (Signed)
Pt aware of lab results and was advised to start back on chol med and recheck labs in 4 months. Pt voiced understanding. JSY

## 2022-11-17 LAB — CYTOLOGY - PAP
Adequacy: ABSENT
Comment: NEGATIVE
Diagnosis: NEGATIVE
High risk HPV: NEGATIVE

## 2022-11-19 ENCOUNTER — Telehealth: Payer: Self-pay | Admitting: *Deleted

## 2022-11-19 ENCOUNTER — Other Ambulatory Visit: Payer: Self-pay | Admitting: Adult Health

## 2022-11-19 MED ORDER — METRONIDAZOLE 500 MG PO TABS: 500.0000 mg | ORAL_TABLET | Freq: Two times a day (BID) | ORAL | 0 refills | Status: DC

## 2022-11-19 NOTE — Telephone Encounter (Signed)
Pt aware pap was negative for HPV and malignancy but + for BV. Flagyl was sent to pharmacy. No sex or alcohol wile taking med. Pt voiced understanding. JSY

## 2022-11-19 NOTE — Telephone Encounter (Signed)
-----   Message from Elgin sent at 11/19/2022  9:35 AM EDT ----- Let her know pap negative HPV and malignancy but +BV on pap will rx flagyl no sex or alcohol while taking THX

## 2022-11-19 NOTE — Progress Notes (Signed)
+  BV on pap will rx flagyl no sex or alcohol while taking

## 2022-11-27 IMAGING — MG DIGITAL SCREENING BREAST BILAT IMPLANT W/ TOMO W/ CAD
9 of 12 series · 9 of 28 positions shown · non-contrast
Comparison: Previous exam(s).

CLINICAL DATA: Screening.

EXAM:
DIGITAL SCREENING BILATERAL MAMMOGRAM WITH IMPLANTS, CAD AND
TOMOSYNTHESIS
TECHNIQUE: Bilateral screening digital craniocaudal and mediolateral oblique
mammograms were obtained. Bilateral screening digital breast
tomosynthesis was performed. The images were evaluated with
computer-aided detection. Standard and/or implant displaced views
were performed.

[R CC (1 of 2)]
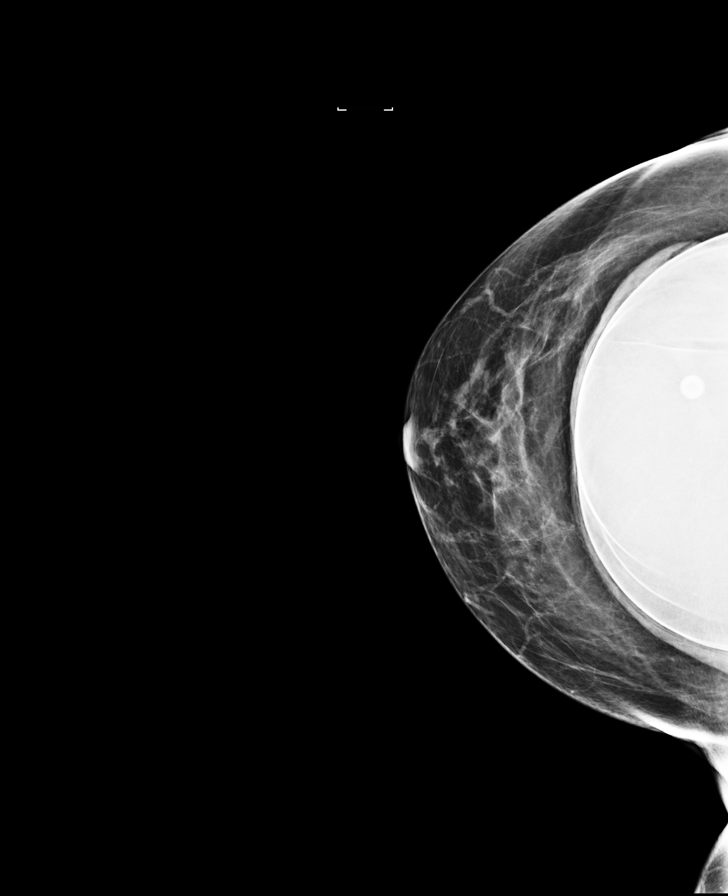

[R MLO (1 of 2)]
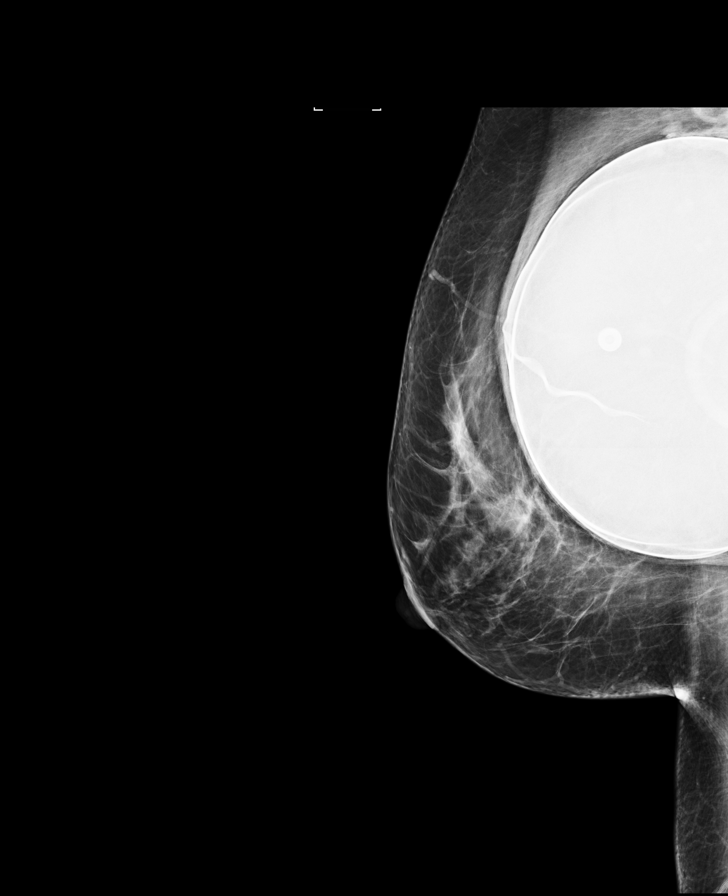

[L MLO (1 of 2)]
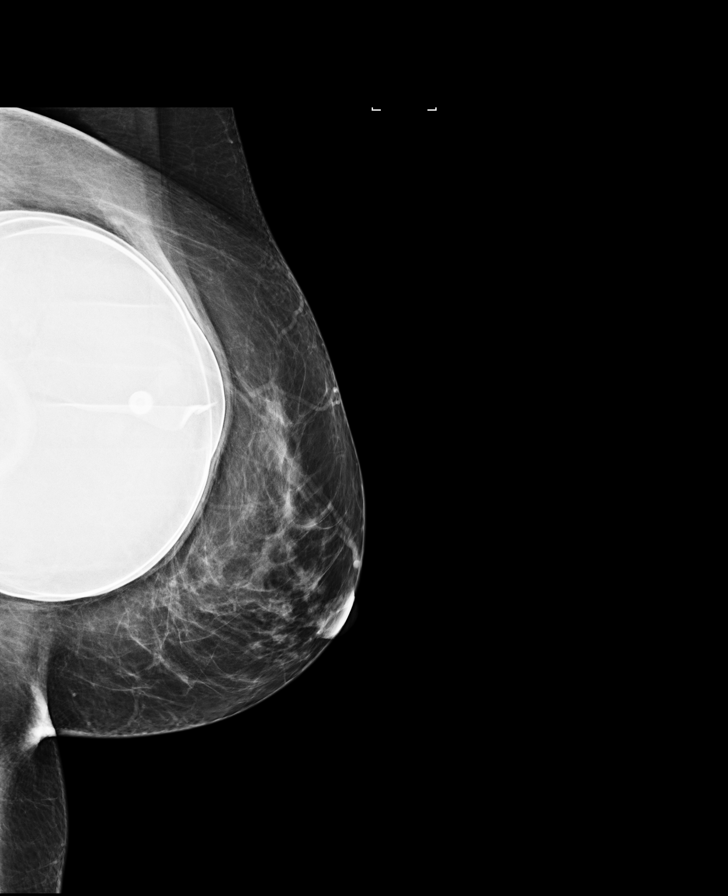

[L CC (1 of 2)]
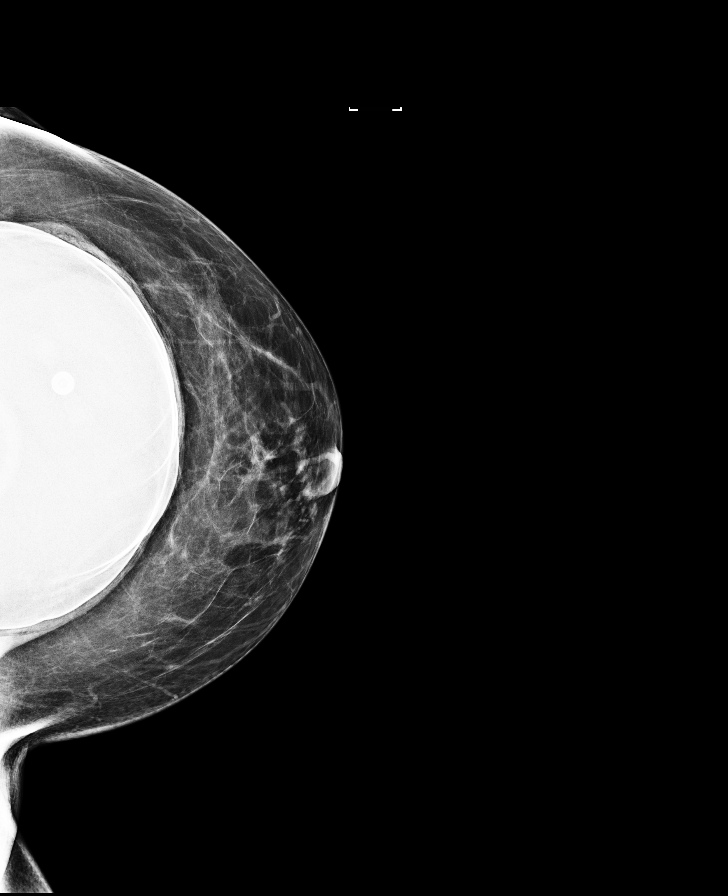

[L MLO (2 of 2)]
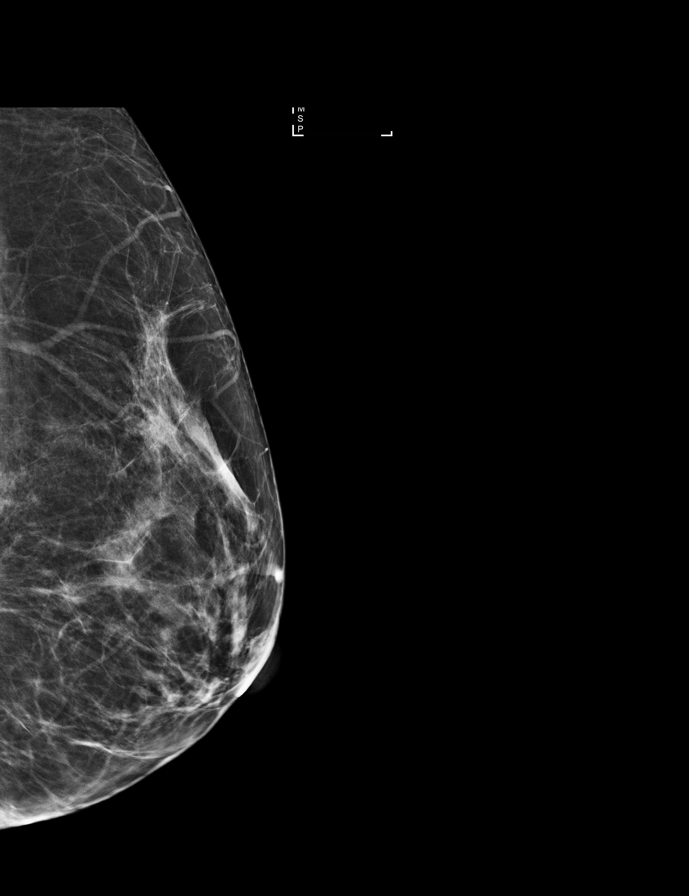

[R CC (2 of 2)]
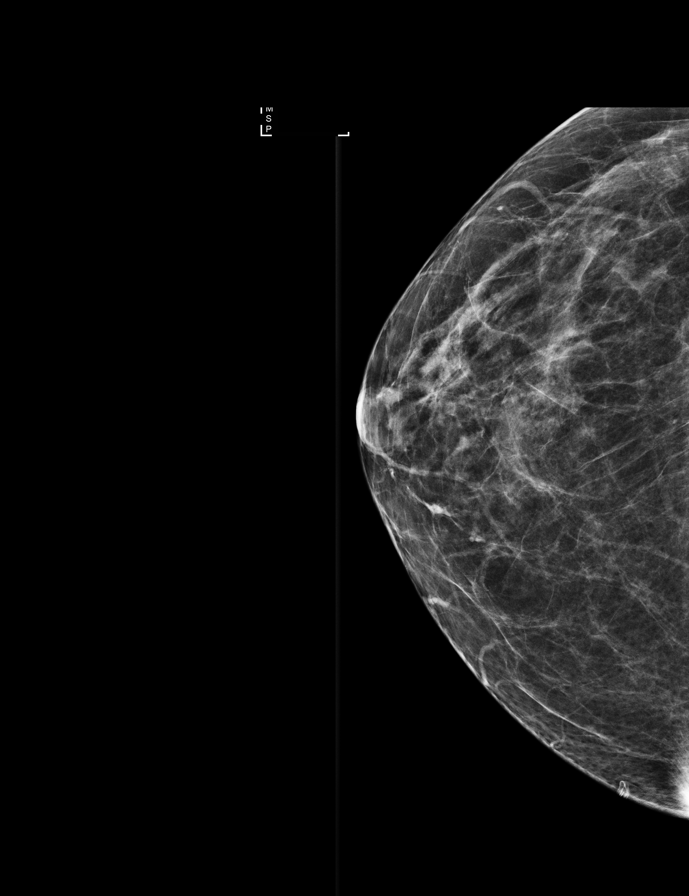

[R MLO (2 of 2)]
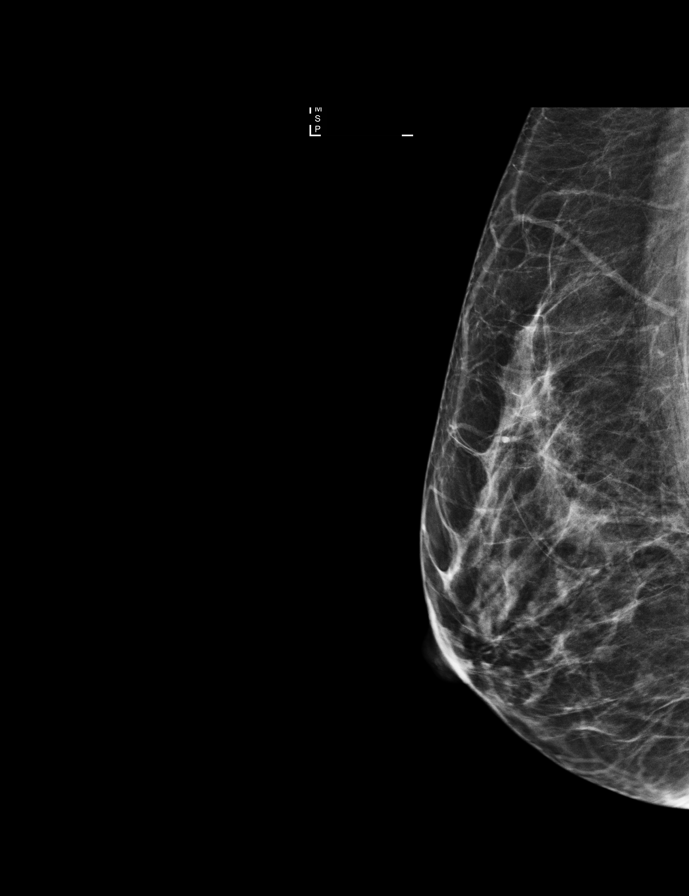

[L CC (2 of 2)]
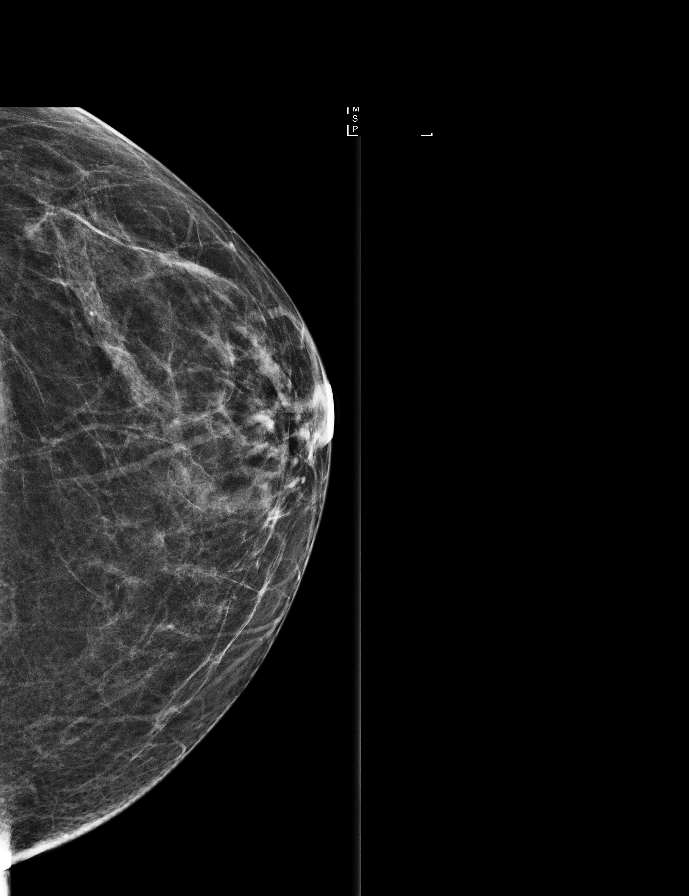

[R MLOID BREAST TOMOSYNTHESIS IMAGE tomo · tomo slice 27/54.0]
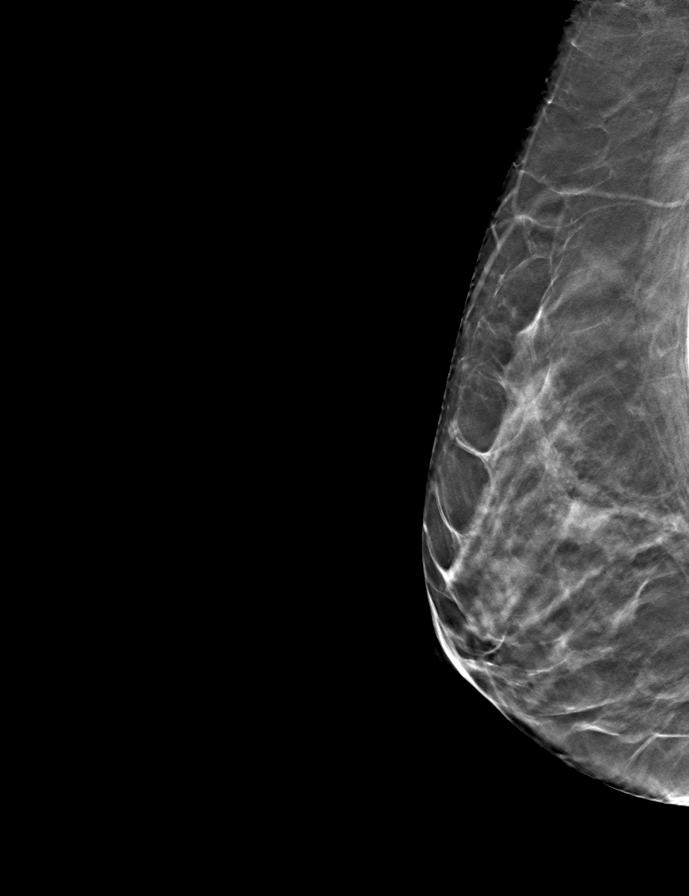

[9 of 28 positions shown; findings below may reference images not displayed]

ACR Breast Density Category b: There are scattered areas of
fibroglandular density.
FINDINGS: The patient has retropectoral implants. There are no findings
suspicious for malignancy.
IMPRESSION: No mammographic evidence of malignancy. A result letter of this
screening mammogram will be mailed directly to the patient.

RECOMMENDATION:
Screening mammogram in one year. (Code:SE-S-JMG)

BI-RADS CATEGORY  1:  Negative.

## 2022-12-10 ENCOUNTER — Other Ambulatory Visit: Payer: Self-pay | Admitting: Adult Health

## 2023-02-12 ENCOUNTER — Encounter: Payer: Self-pay | Admitting: Emergency Medicine

## 2023-02-12 ENCOUNTER — Other Ambulatory Visit: Payer: Self-pay

## 2023-02-12 ENCOUNTER — Ambulatory Visit
Admission: EM | Admit: 2023-02-12 | Discharge: 2023-02-12 | Disposition: A | Discharge status: Home / Self Care | Payer: 59 | Attending: Nurse Practitioner | Admitting: Nurse Practitioner

## 2023-02-12 DIAGNOSIS — M940 Chondrocostal junction syndrome [Tietze]: Secondary | ICD-10-CM | POA: Diagnosis not present

## 2023-02-12 DIAGNOSIS — J101 Influenza due to other identified influenza virus with other respiratory manifestations: Secondary | ICD-10-CM | POA: Diagnosis not present

## 2023-02-12 LAB — POC COVID19/FLU A&B COMBO
Covid Antigen, POC: NEGATIVE
Influenza A Antigen, POC: POSITIVE — AB
Influenza B Antigen, POC: NEGATIVE

## 2023-02-12 MED ORDER — DEXAMETHASONE SODIUM PHOSPHATE 10 MG/ML IJ SOLN
10.0000 mg | INTRAMUSCULAR | Status: AC
  Administered 2023-02-12: 10 mg via INTRAMUSCULAR

## 2023-02-12 MED ORDER — PROMETHAZINE-DM 6.25-15 MG/5ML PO SYRP: 5.0000 mL | ORAL_SOLUTION | Freq: Four times a day (QID) | ORAL | 0 refills | Status: DC | PRN

## 2023-02-12 MED ORDER — PREDNISONE 20 MG PO TABS: 40.0000 mg | ORAL_TABLET | Freq: Every day | ORAL | 0 refills | Status: AC

## 2023-02-12 NOTE — ED Provider Notes (Signed)
[facility]    CSN: [id number] Arrival date & time: 02/12/23  3244      History   Chief Complaint No chief complaint on file.   HPI Allison Carr is a 57 y.o. female.   The history is provided by the patient.   Patient presents for complaints of bodyaches, chest congestion, nausea, vomiting, diarrhea, and bilateral rib cage pain.  Patient states symptoms started over the past several days.  She "is pretty sure" she has had a fever since her symptoms started.  She denies headache, ear pain, wheezing, difficulty breathing, abdominal pain, constipation, or rash.  Reports she has been taking over-the-counter cough and cold medications for her symptoms, along with ibuprofen for pain.  Past Medical History:  Diagnosis Date   Anxiety    Depression    Elevated cholesterol 05/20/2017   GERD (gastroesophageal reflux disease)    Hypertension    Numerous moles 06/01/2013   Refer to Dr Orvan Falconer to check moles   Vitamin D deficiency 06/28/2015    Patient Active Problem List   Diagnosis Date Noted   Encounter for well woman exam with routine gynecological exam 05/07/2021   Skin tag 05/07/2021   Elevated blood sugar 05/07/2021   Screening for colorectal cancer 05/07/2021   Encounter for screening fecal occult blood testing 03/21/2020   Screening mammogram for breast cancer 03/21/2020   Smoker 03/21/2020   Encounter for colorectal cancer screening 03/21/2020   Elevated cholesterol 05/20/2017   Encounter for gynecological examination with Papanicolaou smear of cervix 05/05/2017   Vitamin D deficiency 06/28/2015   Essential hypertension 06/01/2013   Numerous moles 06/01/2013   Depression 06/01/2013    Past Surgical History:  Procedure Laterality Date   AUGMENTATION MAMMAPLASTY Bilateral    ENDOMETRIAL ABLATION     TUBAL LIGATION      OB History     Gravida  2   Para  2   Term  2   Preterm      AB      Living  2      SAB      IAB      Ectopic       Multiple      Live Births  2            Home Medications    Prior to Admission medications   Medication Sig Start Date End Date Taking? Authorizing Provider  esomeprazole (NEXIUM) 40 MG capsule TAKE 1 CAPSULE BY MOUTH EVERY MORNING. 12/11/22   Adline Potter, NP  metroNIDAZOLE (FLAGYL) 500 MG tablet Take 1 tablet (500 mg total) by mouth 2 (two) times daily. 11/19/22   Adline Potter, NP  simvastatin (ZOCOR) 20 MG tablet Take 1 tablet (20 mg total) by mouth daily. 11/12/22   Adline Potter, NP  triamterene-hydrochlorothiazide (DYAZIDE) 37.5-25 MG capsule Take 1 daily 11/12/22   Cyril Mourning A, NP  venlafaxine XR (EFFEXOR-XR) 150 MG 24 hr capsule TAKE 1 CAPSULE BY MOUTH DAILY. 11/12/22   Adline Potter, NP    Family History Family History  Problem Relation Age of Onset   Cancer Mother        breast   Diabetes Mother    Kidney disease Father    Heart disease Father    Heart disease Paternal Grandmother    Heart disease Paternal Grandfather     Social History Social History   Tobacco Use   Smoking status: Every Day    Current packs/day:  1.00    Average packs/day: 1 pack/day for 20.0 years (20.0 ttl pk-yrs)    Types: Cigarettes   Smokeless tobacco: Never  Vaping Use   Vaping status: Some Days  Substance Use Topics   Alcohol use: Yes    Comment: occassionally   Drug use: No     Allergies   Patient has no known allergies.   Review of Systems Review of Systems Per HPI  Physical Exam Triage Vital Signs ED Triage Vitals  Encounter Vitals Group     BP 02/12/23 1022 (!) 161/74     Systolic BP Percentile --      Diastolic BP Percentile --      Pulse Rate 02/12/23 1022 (!) 107     Resp 02/12/23 1022 20     Temp 02/12/23 1022 99.1 F (37.3 C)     Temp Source 02/12/23 1022 Oral     SpO2 02/12/23 1022 96 %     Weight --      Height --      Head Circumference --      Peak Flow --      Pain Score 02/12/23 1024 4     Pain Loc --      Pain  Education --      Exclude from Growth Chart --    No data found.  Updated Vital Signs BP (!) 161/74 (BP Location: Right Arm)   Pulse (!) 107   Temp 99.1 F (37.3 C) (Oral)   Resp 20   SpO2 96%   Visual Acuity Right Eye Distance:   Left Eye Distance:   Bilateral Distance:    Right Eye Near:   Left Eye Near:    Bilateral Near:     Physical Exam Vitals and nursing note reviewed.  Constitutional:      General: She is not in acute distress.    Appearance: Normal appearance.  HENT:     Head: Normocephalic.     Right Ear: Tympanic membrane, ear canal and external ear normal.     Left Ear: Tympanic membrane, ear canal and external ear normal.     Nose: Nose normal.     Right Turbinates: Enlarged and swollen.     Left Turbinates: Enlarged and swollen.     Right Sinus: No maxillary sinus tenderness or frontal sinus tenderness.     Left Sinus: No maxillary sinus tenderness or frontal sinus tenderness.     Mouth/Throat:     Lips: Pink.     Mouth: Mucous membranes are moist.     Pharynx: Postnasal drip present. No pharyngeal swelling, oropharyngeal exudate or posterior oropharyngeal erythema.  Eyes:     Extraocular Movements: Extraocular movements intact.     Conjunctiva/sclera: Conjunctivae normal.     Pupils: Pupils are equal, round, and reactive to light.  Cardiovascular:     Rate and Rhythm: Normal rate and regular rhythm.     Pulses: Normal pulses.     Heart sounds: Normal heart sounds.  Pulmonary:     Effort: Pulmonary effort is normal.     Breath sounds: Normal breath sounds.  Abdominal:     General: Bowel sounds are normal.     Palpations: Abdomen is soft.  Musculoskeletal:     Cervical back: Normal range of motion.  Lymphadenopathy:     Cervical: No cervical adenopathy.  Skin:    General: Skin is warm and dry.  Neurological:     General: No focal deficit present.  Mental Status: She is alert and oriented to person, place, and time.  Psychiatric:         Mood and Affect: Mood normal.        Behavior: Behavior normal.      UC Treatments / Results  Labs (all labs ordered are listed, but only abnormal results are displayed) Labs Reviewed  POC COVID19/FLU A&B COMBO    EKG   Radiology No results found.  Procedures Procedures (including critical care time)  Medications Ordered in UC Medications  dexamethasone (DECADRON) injection 10 mg (has no administration in time range)    Initial Impression / Assessment and Plan / UC Course  I have reviewed the triage vital signs and the nursing notes.  Pertinent labs & imaging results that were available during my care of the patient were reviewed by me and considered in my medical decision making (see chart for details).  COVID/flu test was positive for influenza A.  Patient is out of the window to receive Tamiflu at this time.  She also has symptoms consistent with costochondritis to the amount of coughing she has been doing.  Decadron 10 mg IM administered in the clinic.  Will start patient on prednisone 40 mg daily for costochondritis, and Promethazine DM for her cough.  Discussed viral etiology with the patient and when follow-up will be indicated.  Patient was in agreement with this plan of care and verbalizes understanding.  All questions were answered.  Patient stable for discharge.  Final Clinical Impressions(s) / UC Diagnoses   Final diagnoses:  None   Discharge Instructions   None    ED Prescriptions   None    PDMP not reviewed this encounter.   Abran Cantor, NP 02/12/23 1109

## 2023-02-12 NOTE — ED Triage Notes (Signed)
Pt reports emesis, diarrhea, chest congestion,chills since Monday. Pt reports bilateral rib pain with cough/movement.

## 2023-02-12 NOTE — Discharge Instructions (Addendum)
You have tested positive for influenza A.   You are out of the window to begin treatment.  Continue symptomatic treatment to include fluids, rest, over-the-counter analgesics, and medications as prescribed. You have been given an injection of Decadron 10 mg today.  Start the prednisone on 02/13/2023. Symptoms should improve over the next 7 to 10 days.  If symptoms do not improve before that time, or worsen, you may follow-up in this clinic or with your primary care physician for further evaluation. Follow-up as needed.

## 2023-03-26 ENCOUNTER — Encounter: Payer: Self-pay | Admitting: Emergency Medicine

## 2023-03-26 ENCOUNTER — Ambulatory Visit
Admission: EM | Admit: 2023-03-26 | Discharge: 2023-03-26 | Disposition: A | Discharge status: Home / Self Care | Payer: 59 | Attending: Nurse Practitioner | Admitting: Nurse Practitioner

## 2023-03-26 DIAGNOSIS — N3000 Acute cystitis without hematuria: Secondary | ICD-10-CM

## 2023-03-26 LAB — POCT URINALYSIS DIP (MANUAL ENTRY)
Bilirubin, UA: NEGATIVE
Glucose, UA: NEGATIVE mg/dL
Ketones, POC UA: NEGATIVE mg/dL
Nitrite, UA: POSITIVE — AB
Protein Ur, POC: 100 mg/dL — AB
Spec Grav, UA: 1.025 (ref 1.010–1.025)
Urobilinogen, UA: >=8 U/dL — AB
pH, UA: 6.5 (ref 5.0–8.0)

## 2023-03-26 MED ORDER — SULFAMETHOXAZOLE-TRIMETHOPRIM 800-160 MG PO TABS: 1.0000 | ORAL_TABLET | Freq: Two times a day (BID) | ORAL | 0 refills | Status: AC

## 2023-03-26 NOTE — Discharge Instructions (Addendum)
-  The urinalysis shows that you do have a urinary tract infection.  A urine culture has been ordered.  You will be contacted if the results of the culture show that the medication prescribed today needs to be changed. -Take medications as prescribed. -Increase fluids. -Ibuprofen or Tylenol  for pain, fever, or general discomfort. -Develop a toileting schedule that will allow you to urinate at least every 2 hours. -Avoid caffeine to include tea, soda, and coffee. -If sexually active, void at least 15 to 20 minutes after sexual intercourse. -Follow-up in the emergency department if you develop fever, chills, worsening abdominal pain, or other concerns.  -Follow-up as needed.

## 2023-03-26 NOTE — ED Provider Notes (Signed)
 [facility]    CSN: [id number] Arrival date & time: 03/26/23  1613      History   Chief Complaint No chief complaint on file.   HPI Allison Carr is a 58 y.o. female.   The history is provided by the patient.   Patient presents for complaints of pain with urination has been present for the past 48 hours.  She also complains of pain around the right kidney.  She denies fever, chills, urgency, frequency, hesitancy, hematuria, decreased urine stream, or vaginal symptoms.  Patient denies history of recurrent UTIs, kidney infection, or kidney stones.  Past Medical History:  Diagnosis Date   Anxiety    Depression    Elevated cholesterol 05/20/2017   GERD (gastroesophageal reflux disease)    Hypertension    Numerous moles 06/01/2013   Refer to Dr Eda to check moles   Vitamin D  deficiency 06/28/2015    Patient Active Problem List   Diagnosis Date Noted   Encounter for well woman exam with routine gynecological exam 05/07/2021   Skin tag 05/07/2021   Elevated blood sugar 05/07/2021   Screening for colorectal cancer 05/07/2021   Encounter for screening fecal occult blood testing 03/21/2020   Screening mammogram for breast cancer 03/21/2020   Smoker 03/21/2020   Encounter for colorectal cancer screening 03/21/2020   Elevated cholesterol 05/20/2017   Encounter for gynecological examination with Papanicolaou smear of cervix 05/05/2017   Vitamin D  deficiency 06/28/2015   Essential hypertension 06/01/2013   Numerous moles 06/01/2013   Depression 06/01/2013    Past Surgical History:  Procedure Laterality Date   AUGMENTATION MAMMAPLASTY Bilateral    ENDOMETRIAL ABLATION     TUBAL LIGATION      OB History     Gravida  2   Para  2   Term  2   Preterm      AB      Living  2      SAB      IAB      Ectopic      Multiple      Live Births  2            Home Medications    Prior to Admission medications   Medication Sig Start Date End  Date Taking? Authorizing Provider  sulfamethoxazole -trimethoprim  (BACTRIM  DS) 800-160 MG tablet Take 1 tablet by mouth 2 (two) times daily for 7 days. 03/26/23 04/02/23 Yes Leath-Warren, Etta PARAS, NP  esomeprazole  (NEXIUM ) 40 MG capsule TAKE 1 CAPSULE BY MOUTH EVERY MORNING. 12/11/22   Signa Delon LABOR, NP  simvastatin  (ZOCOR ) 20 MG tablet Take 1 tablet (20 mg total) by mouth daily. 11/12/22   Signa Delon LABOR, NP  triamterene -hydrochlorothiazide (DYAZIDE) 37.5-25 MG capsule Take 1 daily 11/12/22   Signa Delon A, NP  venlafaxine  XR (EFFEXOR -XR) 150 MG 24 hr capsule TAKE 1 CAPSULE BY MOUTH DAILY. 11/12/22   Signa Delon LABOR, NP    Family History Family History  Problem Relation Age of Onset   Cancer Mother        breast   Diabetes Mother    Kidney disease Father    Heart disease Father    Heart disease Paternal Grandmother    Heart disease Paternal Grandfather     Social History Social History   Tobacco Use   Smoking status: Every Day    Current packs/day: 1.00    Average packs/day: 1 pack/day for 20.0 years (20.0 ttl pk-yrs)    Types: Cigarettes  Smokeless tobacco: Never  Vaping Use   Vaping status: Some Days  Substance Use Topics   Alcohol use: Yes    Comment: occassionally   Drug use: No     Allergies   Patient has no known allergies.   Review of Systems Review of Systems Per HPI  Physical Exam Triage Vital Signs ED Triage Vitals  Encounter Vitals Group     BP 03/26/23 1645 (!) 149/89     Systolic BP Percentile --      Diastolic BP Percentile --      Pulse Rate 03/26/23 1645 (!) 108     Resp 03/26/23 1645 18     Temp 03/26/23 1645 99.1 F (37.3 C)     Temp Source 03/26/23 1645 Oral     SpO2 03/26/23 1645 97 %     Weight --      Height --      Head Circumference --      Peak Flow --      Pain Score 03/26/23 1646 0     Pain Loc --      Pain Education --      Exclude from Growth Chart --    No data found.  Updated Vital Signs BP (!)  149/89 (BP Location: Right Arm)   Pulse (!) 108   Temp 99.1 F (37.3 C) (Oral)   Resp 18   SpO2 97%   Visual Acuity Right Eye Distance:   Left Eye Distance:   Bilateral Distance:    Right Eye Near:   Left Eye Near:    Bilateral Near:     Physical Exam Vitals and nursing note reviewed.  Constitutional:      General: She is not in acute distress.    Appearance: Normal appearance.  HENT:     Head: Normocephalic.  Eyes:     Extraocular Movements: Extraocular movements intact.     Pupils: Pupils are equal, round, and reactive to light.  Cardiovascular:     Rate and Rhythm: Regular rhythm. Tachycardia present.     Pulses: Normal pulses.     Heart sounds: Normal heart sounds.  Pulmonary:     Effort: Pulmonary effort is normal. No respiratory distress.     Breath sounds: Normal breath sounds. No stridor. No wheezing, rhonchi or rales.  Abdominal:     General: Bowel sounds are normal.     Palpations: Abdomen is soft.     Tenderness: There is no abdominal tenderness. There is right CVA tenderness.  Musculoskeletal:     Cervical back: Normal range of motion.  Skin:    General: Skin is warm and dry.  Neurological:     General: No focal deficit present.     Mental Status: She is alert and oriented to person, place, and time.  Psychiatric:        Mood and Affect: Mood normal.        Behavior: Behavior normal.      UC Treatments / Results  Labs (all labs ordered are listed, but only abnormal results are displayed) Labs Reviewed  POCT URINALYSIS DIP (MANUAL ENTRY) - Abnormal; Notable for the following components:      Result Value   Clarity, UA cloudy (*)    Blood, UA moderate (*)    Protein Ur, POC =100 (*)    Urobilinogen, UA >=8.0 (*)    Nitrite, UA Positive (*)    Leukocytes, UA Small (1+) (*)    All other components within normal  limits  URINE CULTURE    EKG   Radiology No results found.  Procedures Procedures (including critical care  time)  Medications Ordered in UC Medications - No data to display  Initial Impression / Assessment and Plan / UC Course  I have reviewed the triage vital signs and the nursing notes.  Pertinent labs & imaging results that were available during my care of the patient were reviewed by me and considered in my medical decision making (see chart for details).  Urinalysis is positive for UTI in the presence of nitrites, leukocytes, and blood.  Urine culture is pending.  Will treat patient empirically for acute cystitis with Bactrim  DS 800/160 mg for the next 7 days.  Supportive care recommendations were provided and discussed with the patient to include fluids, rest, and over-the-counter analgesics.  Discussed indications with patient regarding follow-up in the emergency department.  Patient was in agreement with this plan of care and verbalizes understanding.  All questions were answered.  Patient stable for discharge.  Final Clinical Impressions(s) / UC Diagnoses   Final diagnoses:  Acute cystitis without hematuria     Discharge Instructions      -The urinalysis shows that you do have a urinary tract infection.  A urine culture has been ordered.  You will be contacted if the results of the culture show that the medication prescribed today needs to be changed. -Take medications as prescribed. -Increase fluids. -Ibuprofen or Tylenol  for pain, fever, or general discomfort. -Develop a toileting schedule that will allow you to urinate at least every 2 hours. -Avoid caffeine to include tea, soda, and coffee. -If sexually active, void at least 15 to 20 minutes after sexual intercourse. -Follow-up in the emergency department if you develop fever, chills, worsening abdominal pain, or other concerns.  -Follow-up as needed.    ED Prescriptions     Medication Sig Dispense Auth. Provider   sulfamethoxazole -trimethoprim  (BACTRIM  DS) 800-160 MG tablet Take 1 tablet by mouth 2 (two) times daily for  7 days. 14 tablet Leath-Warren, Etta PARAS, NP      PDMP not reviewed this encounter.   Gilmer Etta PARAS, NP 03/26/23 1722

## 2023-03-26 NOTE — ED Triage Notes (Signed)
 Burning on urination since Tuesday.

## 2023-03-28 LAB — URINE CULTURE: Culture: >=100000 — AB

## 2023-03-30 ENCOUNTER — Telehealth (HOSPITAL_COMMUNITY): Payer: Self-pay

## 2023-03-30 MED ORDER — NITROFURANTOIN MONOHYD MACRO 100 MG PO CAPS: 100.0000 mg | ORAL_CAPSULE | Freq: Two times a day (BID) | ORAL | 0 refills | Status: AC

## 2023-03-30 MED ORDER — FLUCONAZOLE 150 MG PO TABS: 150.0000 mg | ORAL_TABLET | Freq: Once | ORAL | 0 refills | Status: AC

## 2023-03-30 NOTE — Telephone Encounter (Signed)
 Per protocol, pt to dc Bactrim  and begin treatment with Macrobid .  Reviewed with patient. Pt requesting Diflucan  for abx-associated yeast infection.  Verified pharmacy, prescriptions sent per protocol.

## 2023-06-05 ENCOUNTER — Other Ambulatory Visit: Payer: Self-pay | Admitting: Adult Health

## 2023-06-12 ENCOUNTER — Ambulatory Visit
Admission: EM | Admit: 2023-06-12 | Discharge: 2023-06-12 | Disposition: A | Discharge status: Home / Self Care | Attending: Nurse Practitioner | Admitting: Nurse Practitioner

## 2023-06-12 DIAGNOSIS — N10 Acute tubulo-interstitial nephritis: Secondary | ICD-10-CM | POA: Diagnosis not present

## 2023-06-12 LAB — POCT URINALYSIS DIP (MANUAL ENTRY)
Bilirubin, UA: NEGATIVE
Glucose, UA: NEGATIVE mg/dL
Ketones, POC UA: NEGATIVE mg/dL
Nitrite, UA: NEGATIVE
Protein Ur, POC: >=300 mg/dL — AB
Spec Grav, UA: 1.02
pH, UA: 7

## 2023-06-12 LAB — POC COVID19/FLU A&B COMBO
Covid Antigen, POC: NEGATIVE
Influenza A Antigen, POC: NEGATIVE
Influenza B Antigen, POC: NEGATIVE

## 2023-06-12 MED ORDER — ACETAMINOPHEN 325 MG PO TABS
650.0000 mg | ORAL_TABLET | Freq: Once | ORAL | Status: AC
  Administered 2023-06-12: 650 mg via ORAL

## 2023-06-12 MED ORDER — CEFTRIAXONE SODIUM 1 G IJ SOLR
1.0000 g | Freq: Once | INTRAMUSCULAR | Status: AC
  Administered 2023-06-12: 1 g via INTRAMUSCULAR

## 2023-06-12 MED ORDER — SULFAMETHOXAZOLE-TRIMETHOPRIM 800-160 MG PO TABS: 1.0000 | ORAL_TABLET | Freq: Two times a day (BID) | ORAL | 0 refills | Status: AC

## 2023-06-12 MED ORDER — ONDANSETRON 4 MG PO TBDP: 4.0000 mg | ORAL_TABLET | Freq: Three times a day (TID) | ORAL | 0 refills | Status: AC | PRN

## 2023-06-12 NOTE — ED Triage Notes (Signed)
 Pt reports she has n/v, low right side back pain,and burning with urination x 2 days

## 2023-06-12 NOTE — ED Provider Notes (Signed)
 [facility]    CSN: [id number] Arrival date & time: 06/12/23  0802      History   Chief Complaint No chief complaint on file.   HPI Allison Carr is a 58 y.o. female.   The history is provided by the patient.   Patient presents for complaints of dysuria, generalized fatigue, nausea, vomiting, and right flank pain x 2 days.  Patient states she noticed she was having burning with urination prior to symptoms of fever, chills, nausea, vomiting, and right-sided low back pain started.  Patient denies upper respiratory symptoms, abdominal pain, diarrhea, constipation, hematuria, decreased urine stream, or vaginal symptoms.  Patient reports her last UTI was approximately 2 months ago.  States that she did take ibuprofen for her symptoms.  Past Medical History:  Diagnosis Date   Anxiety    Depression    Elevated cholesterol 05/20/2017   GERD (gastroesophageal reflux disease)    Hypertension    Numerous moles 06/01/2013   Refer to Dr Savannah Curlin to check moles   Vitamin D  deficiency 06/28/2015    Patient Active Problem List   Diagnosis Date Noted   Encounter for well woman exam with routine gynecological exam 05/07/2021   Skin tag 05/07/2021   Elevated blood sugar 05/07/2021   Screening for colorectal cancer 05/07/2021   Encounter for screening fecal occult blood testing 03/21/2020   Screening mammogram for breast cancer 03/21/2020   Smoker 03/21/2020   Encounter for colorectal cancer screening 03/21/2020   Elevated cholesterol 05/20/2017   Encounter for gynecological examination with Papanicolaou smear of cervix 05/05/2017   Vitamin D  deficiency 06/28/2015   Essential hypertension 06/01/2013   Numerous moles 06/01/2013   Depression 06/01/2013    Past Surgical History:  Procedure Laterality Date   AUGMENTATION MAMMAPLASTY Bilateral    ENDOMETRIAL ABLATION     TUBAL LIGATION      OB History     Gravida  2   Para  2   Term  2   Preterm      AB      Living   2      SAB      IAB      Ectopic      Multiple      Live Births  2            Home Medications    Prior to Admission medications   Medication Sig Start Date End Date Taking? Authorizing Provider  ondansetron (ZOFRAN-ODT) 4 MG disintegrating tablet Take 1 tablet (4 mg total) by mouth every 8 (eight) hours as needed. 06/12/23  Yes Leath-Warren, Belen Bowers, NP  sulfamethoxazole -trimethoprim  (BACTRIM  DS) 800-160 MG tablet Take 1 tablet by mouth 2 (two) times daily for 10 days. 06/12/23 06/22/23 Yes Leath-Warren, Belen Bowers, NP  esomeprazole  (NEXIUM ) 40 MG capsule TAKE 1 CAPSULE BY MOUTH EVERY MORNING. 12/11/22   Javan Messing, NP  nitrofurantoin , macrocrystal-monohydrate, (MACROBID ) 100 MG capsule Take 1 capsule (100 mg total) by mouth 2 (two) times daily. 03/30/23   Ann Keto, MD  simvastatin  (ZOCOR ) 20 MG tablet Take 1 tablet (20 mg total) by mouth daily. 11/12/22   Javan Messing, NP  triamterene -hydrochlorothiazide (DYAZIDE) 37.5-25 MG capsule Take 1 daily 11/12/22   Griffin, Jennifer A, NP  venlafaxine  XR (EFFEXOR -XR) 150 MG 24 hr capsule TAKE 1 CAPSULE BY MOUTH DAILY. 06/08/23   Javan Messing, NP    Family History Family History  Problem Relation Age of Onset   Cancer  Mother        breast   Diabetes Mother    Kidney disease Father    Heart disease Father    Heart disease Paternal Grandmother    Heart disease Paternal Grandfather     Social History Social History   Tobacco Use   Smoking status: Every Day    Current packs/day: 1.00    Average packs/day: 1 pack/day for 20.0 years (20.0 ttl pk-yrs)    Types: Cigarettes   Smokeless tobacco: Never  Vaping Use   Vaping status: Some Days  Substance Use Topics   Alcohol use: Yes    Comment: occassionally   Drug use: No     Allergies   Patient has no known allergies.   Review of Systems Review of Systems Per HPI  Physical Exam Triage Vital Signs ED Triage Vitals  Encounter Vitals  Group     BP 06/12/23 0824 (!) 144/81     Systolic BP Percentile --      Diastolic BP Percentile --      Pulse Rate 06/12/23 0824 (!) 114     Resp 06/12/23 0824 20     Temp 06/12/23 0824 (!) 101.1 F (38.4 C)     Temp Source 06/12/23 0824 Oral     SpO2 06/12/23 0824 95 %     Weight --      Height --      Head Circumference --      Peak Flow --      Pain Score 06/12/23 0825 7     Pain Loc --      Pain Education --      Exclude from Growth Chart --    No data found.  Updated Vital Signs BP (!) 144/81 (BP Location: Right Arm)   Pulse (!) 114   Temp (!) 101.1 F (38.4 C) (Oral)   Resp 20   SpO2 95%   Visual Acuity Right Eye Distance:   Left Eye Distance:   Bilateral Distance:    Right Eye Near:   Left Eye Near:    Bilateral Near:     Physical Exam Vitals and nursing note reviewed.  Constitutional:      General: She is not in acute distress.    Appearance: Normal appearance.  HENT:     Head: Normocephalic.     Mouth/Throat:     Mouth: Mucous membranes are moist.  Eyes:     Extraocular Movements: Extraocular movements intact.     Conjunctiva/sclera: Conjunctivae normal.     Pupils: Pupils are equal, round, and reactive to light.  Cardiovascular:     Rate and Rhythm: Tachycardia present.     Pulses: Normal pulses.     Heart sounds: Normal heart sounds.  Pulmonary:     Effort: Pulmonary effort is normal.     Breath sounds: Normal breath sounds.  Abdominal:     General: Bowel sounds are normal.     Palpations: Abdomen is soft.     Tenderness: There is abdominal tenderness. There is right CVA tenderness and left CVA tenderness.  Musculoskeletal:     Cervical back: Normal range of motion.  Skin:    General: Skin is warm and dry.  Neurological:     General: No focal deficit present.     Mental Status: She is alert and oriented to person, place, and time.  Psychiatric:        Mood and Affect: Mood normal.        Behavior:  Behavior normal.      UC  Treatments / Results  Labs (all labs ordered are listed, but only abnormal results are displayed) Labs Reviewed  POCT URINALYSIS DIP (MANUAL ENTRY) - Abnormal; Notable for the following components:      Result Value   Color, UA brown (*)    Clarity, UA cloudy (*)    Blood, UA moderate (*)    Protein Ur, POC >=300 (*)    Leukocytes, UA Moderate (2+) (*)    All other components within normal limits  POC COVID19/FLU A&B COMBO - Normal  URINE CULTURE    EKG   Radiology No results found.  Procedures Procedures (including critical care time)  Medications Ordered in UC Medications  cefTRIAXone (ROCEPHIN) injection 1 g (has no administration in time range)  acetaminophen  (TYLENOL ) tablet 650 mg (650 mg Oral Given 06/12/23 4098)    Initial Impression / Assessment and Plan / UC Course  I have reviewed the triage vital signs and the nursing notes.  Pertinent labs & imaging results that were available during my care of the patient were reviewed by me and considered in my medical decision making (see chart for details).  COVID/flu test was negative.  Urinalysis shows moderate leukocytes, protein, and blood.  Urine culture is pending.  Patient febrile during triage, acetaminophen  650 mg administered.  On exam, patient with moderate CVA tenderness, greater on the right side.  Concern for pyelonephritis.  Rocephin 1 g IM administered in the clinic.  Will treat with Bactrim  DS 800/160 mg tablets twice daily for the next 10 days.  Zofran 4 mg ODT provided for nausea and vomiting.  Supportive care recommendations were provided and discussed with the patient to include fluids, rest, and to monitor symptoms for worsening.  Recommend follow up with her PCP within the next 7 to 10 days.  Patient was given strict ER follow-up precautions.  Patient was in agreement with this plan of care and verbalizes understanding.  All questions were answered.  Patient stable for discharge.   Final Clinical  Impressions(s) / UC Diagnoses   Final diagnoses:  Acute pyelonephritis     Discharge Instructions      I am treating you for a kidney infection.  Take medication as prescribed.  A urine culture has been ordered.  You will be contacted if the results of the culture show that the medication needs to be changed.  It is very important for you to monitor your symptoms for worsening.  If symptoms are not improving over the next 12 to 24 hours, please go to the emergency department immediately, or sooner if symptoms worsen. Make sure you are drinking at least 10-12 8 ounce glasses of water daily while symptoms persist. Continue over-the-counter Tylenol  for pain, fever, or general discomfort.  Monitor your fever closely.  If it exceeds 103, please go to the emergency department immediately. I would like for you to follow-up with your primary care physician within the next 7 to 10 days for reevaluation. Follow-up as needed.     ED Prescriptions     Medication Sig Dispense Auth. Provider   sulfamethoxazole -trimethoprim  (BACTRIM  DS) 800-160 MG tablet Take 1 tablet by mouth 2 (two) times daily for 10 days. 20 tablet Leath-Warren, Belen Bowers, NP   ondansetron (ZOFRAN-ODT) 4 MG disintegrating tablet Take 1 tablet (4 mg total) by mouth every 8 (eight) hours as needed. 20 tablet Leath-Warren, Belen Bowers, NP      PDMP not reviewed this encounter.  Hardy Lia, NP 06/12/23 [phone]

## 2023-06-12 NOTE — Discharge Instructions (Signed)
 I am treating you for a kidney infection.  Take medication as prescribed.  A urine culture has been ordered.  You will be contacted if the results of the culture show that the medication needs to be changed.  It is very important for you to monitor your symptoms for worsening.  If symptoms are not improving over the next 12 to 24 hours, please go to the emergency department immediately, or sooner if symptoms worsen. Make sure you are drinking at least 10-12 8 ounce glasses of water daily while symptoms persist. Continue over-the-counter Tylenol  for pain, fever, or general discomfort.  Monitor your fever closely.  If it exceeds 103, please go to the emergency department immediately. I would like for you to follow-up with your primary care physician within the next 7 to 10 days for reevaluation. Follow-up as needed.

## 2023-06-13 ENCOUNTER — Other Ambulatory Visit: Payer: Self-pay | Admitting: Adult Health

## 2023-06-15 ENCOUNTER — Telehealth (HOSPITAL_COMMUNITY): Payer: Self-pay

## 2023-06-15 LAB — URINE CULTURE: Culture: >=100000 — AB

## 2023-06-15 MED ORDER — CIPROFLOXACIN HCL 500 MG PO TABS: 500.0000 mg | ORAL_TABLET | Freq: Two times a day (BID) | ORAL | 0 refills | Status: AC

## 2023-06-15 NOTE — Telephone Encounter (Signed)
-----   Message from [place] sent at 06/15/2023  5:17 PM EDT ----- Yes, stop the bactrim  and do cipro 500mg  BID x 7 days ----- Message ----- From: Sharilyn Davenport, RN Sent: 06/15/2023   5:07 PM EDT To: Hardy Lia, NP; #  Pt went home on Bactrim  for pyelo. Culture is resistant. Please advise regarding treatment. Cipro?

## 2023-07-14 ENCOUNTER — Other Ambulatory Visit: Payer: Self-pay | Admitting: Adult Health

## 2023-11-09 ENCOUNTER — Other Ambulatory Visit (HOSPITAL_COMMUNITY): Payer: Self-pay | Admitting: Adult Health

## 2023-11-09 DIAGNOSIS — Z1231 Encounter for screening mammogram for malignant neoplasm of breast: Secondary | ICD-10-CM

## 2023-11-23 ENCOUNTER — Inpatient Hospital Stay (HOSPITAL_COMMUNITY): Admission: RE | Admit: 2023-11-23 | Source: Ambulatory Visit

## 2023-12-08 ENCOUNTER — Ambulatory Visit: Admitting: Obstetrics and Gynecology

## 2023-12-08 ENCOUNTER — Encounter: Payer: Self-pay | Admitting: Obstetrics and Gynecology

## 2023-12-08 VITALS — BP 136/80 | HR 99 | Ht 62.0 in | Wt 145.8 lb

## 2023-12-08 DIAGNOSIS — F32A Depression, unspecified: Secondary | ICD-10-CM

## 2023-12-08 DIAGNOSIS — I10 Essential (primary) hypertension: Secondary | ICD-10-CM | POA: Diagnosis not present

## 2023-12-08 DIAGNOSIS — E7800 Pure hypercholesterolemia, unspecified: Secondary | ICD-10-CM

## 2023-12-08 DIAGNOSIS — Z01419 Encounter for gynecological examination (general) (routine) without abnormal findings: Secondary | ICD-10-CM

## 2023-12-08 NOTE — Progress Notes (Signed)
 cbc  ANNUAL EXAM Patient name: Allison Carr MRN [id number]  Date of birth: 1965-07-06 Chief Complaint:   Annual Exam  History of Present Illness:   Allison Carr is a 58 y.o. [phone]  female being seen today for a routine annual exam.  Current complaints: here for annual exam. Denies vaginal bleeding, pelvic pain. Taking bp medication and statin. Doing well on effexor  at current dose   No LMP recorded. Patient has had an ablation.   Upstream - 12/08/23 1004       Pregnancy Intention Screening   Does the patient want to become pregnant in the next year? No    Does the patient's partner want to become pregnant in the next year? No    Would the patient like to discuss contraceptive options today? No      Contraception Wrap Up   Current Method Female Sterilization    End Method Female Sterilization         The pregnancy intention screening data noted above was reviewed. Potential methods of contraception were discussed. The patient elected to proceed with Female Sterilization.   Gynecologic History Patient's last menstrual period was . Contraception:  Last Pap:11/12/22 . Results were: NILM, HPV - Last mammogram: 08/2022 normal, missed her 2025 appt      12/08/2023   10:04 AM 11/12/2022    1:28 PM 05/07/2021    2:30 PM 03/21/2020    1:53 PM 05/05/2017    3:45 PM  Depression screen PHQ 2/9  Decreased Interest 0 0 0 0 0  Down, Depressed, Hopeless 0 1 0 0 1  PHQ - 2 Score 0 1 0 0 1  Altered sleeping 0 0 0 0   Tired, decreased energy 0 1 0 0   Change in appetite 0 0 0 0   Feeling bad or failure about yourself  0 0 0 0   Trouble concentrating 0 0 0 0   Moving slowly or fidgety/restless 0 0 0 0   Suicidal thoughts 0 0 0 0   PHQ-9 Score 0 2 0 0         12/08/2023   10:04 AM 11/12/2022    1:29 PM 05/07/2021    2:30 PM 03/21/2020    1:54 PM  GAD 7 : Generalized Anxiety Score  Nervous, Anxious, on Edge 1 1 0 0  Control/stop worrying 0 0 0 1  Worry too much - different things 0  1 0 1  Trouble relaxing 0 0 0 1  Restless 0 0 0 0  Easily annoyed or irritable 1 1 0 0  Afraid - awful might happen 0 0 0 0  Total GAD 7 Score 2 3 0 3     Review of Systems:   Pertinent items are noted in HPI Denies any headaches, blurred vision, fatigue, shortness of breath, chest pain, abdominal pain, abnormal vaginal discharge/itching/odor/irritation, problems with periods, bowel movements, urination, or intercourse unless otherwise stated above. Pertinent History Reviewed:  Reviewed past medical,surgical, social and family history.  Reviewed problem list, medications and allergies. Physical Assessment:   Vitals:   12/08/23 0952  BP: 136/80  Pulse: 99  Weight: 145 lb 12.8 oz (66.1 kg)  Height: 5' 2 (1.575 m)  Body mass index is 26.67 kg/m.        Physical Examination:   General appearance - well appearing, and in no distress  Mental status - alert, oriented   Psych:  She has a normal mood and affect  Skin -  warm and dry, normal color  Chest - effort normal, all lung fields clear to auscultation bilaterally  Heart - normal rate and regular rhythm  Neck:  midline trachea, no thyromegaly or nodules  Breasts - breasts appear normal, no suspicious masses, no skin or nipple changes or  axillary nodes  Abdomen - soft, nontender, nondistended  Pelvic - VULVA: normal appearing vulva with no masses, tenderness or lesions  VAGINA: normal appearing vagina with normal color and discharge, no lesions  CERVIX: normal appearing cervix without discharge or lesions, no CMT  UTERUS: uterus is felt to be normal size, shape, consistency and nontender   ADNEXA: No adnexal masses or tenderness noted.  Extremities:  No swelling or varicosities noted  Chaperone present for exam  No results found for this or any previous visit (from the past 24 hours).  Assessment & Plan:  1. Encounter for annual routine gynecological examination (Primary) UTD on pap Encouraged to reschedule mammogram   -  CBC - Comp Met (CMET) - Lipid panel  2. Depression, unspecified depression type Doing well on effexor   3. Essential hypertension Continue Dyazide  4. Elevated cholesterol Taking zocor , checking lipid panel today    Labs/procedures today:   Mammogram: schedule screening mammo as soon as possible, or sooner if problems  Orders Placed This Encounter  Procedures   CBC   Comp Met (CMET)   Lipid panel    Meds: No orders of the defined types were placed in this encounter.   Follow-up: Return in about 1 year (around 12/07/2024) for [place] Nidia Delores, FNP

## 2023-12-09 LAB — COMPREHENSIVE METABOLIC PANEL WITH GFR
ALT: 22 IU/L (ref 0–32)
AST: 28 IU/L (ref 0–40)
Albumin: 4.3 g/dL (ref 3.8–4.9)
Alkaline Phosphatase: 123 IU/L (ref 49–135)
BUN/Creatinine Ratio: 12 (ref 9–23)
BUN: 8 mg/dL (ref 6–24)
Bilirubin Total: 0.2 mg/dL (ref 0.0–1.2)
CO2: 26 mmol/L (ref 20–29)
Calcium: 9.9 mg/dL (ref 8.7–10.2)
Chloride: 100 mmol/L (ref 96–106)
Creatinine, Ser: 0.66 mg/dL (ref 0.57–1.00)
Globulin, Total: 2.8 g/dL (ref 1.5–4.5)
Glucose: 102 mg/dL — ABNORMAL HIGH (ref 70–99)
Potassium: 4.7 mmol/L (ref 3.5–5.2)
Sodium: 142 mmol/L (ref 134–144)
Total Protein: 7.1 g/dL (ref 6.0–8.5)
eGFR: 102 mL/min/1.73 (ref >59)

## 2023-12-09 LAB — CBC
Hematocrit: 45.7 % (ref 34.0–46.6)
Hemoglobin: 15.7 g/dL (ref 11.1–15.9)
MCH: 35.4 pg — ABNORMAL HIGH (ref 26.6–33.0)
MCHC: 34.4 g/dL (ref 31.5–35.7)
MCV: 103 fL — ABNORMAL HIGH (ref 79–97)
Platelets: 345 x10E3/uL (ref 150–450)
RBC: 4.44 x10E6/uL (ref 3.77–5.28)
RDW: 11.8 % (ref 11.7–15.4)
WBC: 4.9 x10E3/uL (ref 3.4–10.8)

## 2023-12-13 ENCOUNTER — Ambulatory Visit: Payer: Self-pay | Admitting: Obstetrics and Gynecology

## 2023-12-14 NOTE — Telephone Encounter (Signed)
-----   Message from [facility] sent at 12/14/2023  1:14 PM EDT ----- Can we let her know CMP and cbc overall normal. Still awaiting lipid panel  ----- Message ----- From: Interface, [facility] Lab Results In Sent: 12/09/2023   5:37 AM EDT To: Nidia LITTIE Daring, FNP

## 2023-12-14 NOTE — Telephone Encounter (Signed)
 Left message @ 1:22 pm, letting pt know CMP and CBC overall is normal. Still waiting on Lipid panel. JSY

## 2023-12-30 ENCOUNTER — Ambulatory Visit (HOSPITAL_COMMUNITY)
Admission: RE | Admit: 2023-12-30 | Discharge: 2023-12-30 | Disposition: A | Discharge status: Home / Self Care | Source: Ambulatory Visit | Attending: Adult Health | Admitting: Adult Health

## 2023-12-30 DIAGNOSIS — Z1231 Encounter for screening mammogram for malignant neoplasm of breast: Secondary | ICD-10-CM | POA: Insufficient documentation

## 2024-01-04 ENCOUNTER — Ambulatory Visit: Payer: Self-pay | Admitting: Adult Health

## 2024-01-04 NOTE — Telephone Encounter (Signed)
 Left message @ 1:52 pm, letting pt know mammogram was negative and next mammogram will be due in 12 year. JSY

## 2024-01-04 NOTE — Telephone Encounter (Signed)
-----   Message from Delon Lewis sent at 01/04/2024  8:37 AM EST ----- Let her know mammogram was negative THX

## 2024-01-04 NOTE — Telephone Encounter (Signed)
 Next mammogram due in 1 year, not 12 years. JSY
